# Patient Record
Sex: Male | Born: 1935 | Race: White | Hispanic: No | Marital: Married | Smoking: Former smoker
Health system: Southern US, Community
[De-identification: ages and names within clinical notes are randomized; demographics above are authoritative.]

## PROBLEM LIST (undated history)

## (undated) DIAGNOSIS — D72829 Elevated white blood cell count, unspecified: Secondary | ICD-10-CM

## (undated) DIAGNOSIS — J9690 Respiratory failure, unspecified, unspecified whether with hypoxia or hypercapnia: Secondary | ICD-10-CM

## (undated) DIAGNOSIS — J449 Chronic obstructive pulmonary disease, unspecified: Secondary | ICD-10-CM

## (undated) DIAGNOSIS — I10 Essential (primary) hypertension: Secondary | ICD-10-CM

## (undated) DIAGNOSIS — I714 Abdominal aortic aneurysm, without rupture: Secondary | ICD-10-CM

## (undated) DIAGNOSIS — I4891 Unspecified atrial fibrillation: Secondary | ICD-10-CM

## (undated) DIAGNOSIS — J209 Acute bronchitis, unspecified: Secondary | ICD-10-CM

## (undated) DIAGNOSIS — C679 Malignant neoplasm of bladder, unspecified: Secondary | ICD-10-CM

## (undated) DIAGNOSIS — E871 Hypo-osmolality and hyponatremia: Secondary | ICD-10-CM

## (undated) DIAGNOSIS — I509 Heart failure, unspecified: Secondary | ICD-10-CM

## (undated) HISTORY — PX: TRANSURETHRAL RESECTION OF BLADDER: SUR1395

## (undated) HISTORY — DX: Acute bronchitis, unspecified: J20.9

## (undated) HISTORY — DX: Chronic obstructive pulmonary disease, unspecified: J44.9

## (undated) HISTORY — DX: Heart failure, unspecified: I50.9

## (undated) HISTORY — DX: Abdominal aortic aneurysm, without rupture: I71.4

## (undated) HISTORY — DX: Hypo-osmolality and hyponatremia: E87.1

## (undated) HISTORY — DX: Malignant neoplasm of bladder, unspecified: C67.9

## (undated) HISTORY — DX: Elevated white blood cell count, unspecified: D72.829

## (undated) HISTORY — DX: Essential (primary) hypertension: I10

## (undated) HISTORY — DX: Unspecified atrial fibrillation: I48.91

## (undated) HISTORY — DX: Respiratory failure, unspecified, unspecified whether with hypoxia or hypercapnia: J96.90

---

## 2003-05-16 ENCOUNTER — Other Ambulatory Visit: Payer: Self-pay

## 2008-05-25 DIAGNOSIS — I714 Abdominal aortic aneurysm, without rupture, unspecified: Secondary | ICD-10-CM

## 2008-05-25 HISTORY — DX: Abdominal aortic aneurysm, without rupture, unspecified: I71.40

## 2008-05-25 HISTORY — DX: Abdominal aortic aneurysm, without rupture: I71.4

## 2008-08-24 ENCOUNTER — Observation Stay: Payer: Self-pay | Admitting: Family Medicine

## 2010-05-15 ENCOUNTER — Encounter: Payer: Self-pay | Admitting: Cardiovascular Disease

## 2010-05-15 ENCOUNTER — Ambulatory Visit: Payer: Self-pay | Admitting: Internal Medicine

## 2010-05-16 ENCOUNTER — Inpatient Hospital Stay: Payer: Self-pay | Admitting: Internal Medicine

## 2010-05-16 ENCOUNTER — Encounter: Payer: Self-pay | Admitting: Cardiovascular Disease

## 2010-05-18 ENCOUNTER — Encounter: Payer: Self-pay | Admitting: Cardiovascular Disease

## 2010-05-20 ENCOUNTER — Telehealth: Payer: Self-pay | Admitting: Cardiovascular Disease

## 2010-05-28 ENCOUNTER — Encounter: Payer: Self-pay | Admitting: Cardiovascular Disease

## 2010-05-28 ENCOUNTER — Ambulatory Visit
Admission: RE | Admit: 2010-05-28 | Discharge: 2010-05-28 | Payer: Self-pay | Source: Home / Self Care | Attending: Cardiovascular Disease | Admitting: Cardiovascular Disease

## 2010-05-28 DIAGNOSIS — I4891 Unspecified atrial fibrillation: Secondary | ICD-10-CM | POA: Insufficient documentation

## 2010-05-28 DIAGNOSIS — J4489 Other specified chronic obstructive pulmonary disease: Secondary | ICD-10-CM | POA: Insufficient documentation

## 2010-05-28 DIAGNOSIS — R0602 Shortness of breath: Secondary | ICD-10-CM | POA: Insufficient documentation

## 2010-05-28 DIAGNOSIS — J449 Chronic obstructive pulmonary disease, unspecified: Secondary | ICD-10-CM | POA: Insufficient documentation

## 2010-06-16 ENCOUNTER — Telehealth: Payer: Self-pay | Admitting: Cardiovascular Disease

## 2010-06-26 NOTE — Assessment & Plan Note (Signed)
Summary: NP6/AMD   Visit Type:  Initial Consult [facility] [place]  CC:  "doing well" denies chest pains, SOB, and and palpitations.  History of Present Illness: Michael Kline is a very pleasant 76 year old gentleman with a long smoking history, hypertension, severe COPD, remote history of atrial ablation in 2004 who presented to [facility] on May 16 2010 with chest congestion, atrial fibrillation with ventricular rate of 140 beats per minute, improved on rate control medication and started on pradaxa who presents for routine followup and to establish care in the clinic.  He reports that he is doing well. He is weaning off his steroids and slowly weaning off his oxygen. He reports having heart rates in the high 80s to 90s. He denies any chest pain. He does still have mild chest congestion. No significant edema. No lightheadedness or dizziness. Blood pressure has been stable he reports.  EKG shows atrial fibrillation with ventricular rate 94 beats a minute, no significant ST or T wave changes  Echocardiogram from the hospital shows ejection fraction 50-55%, mild aortic valve stenosis with mean gradient of 7 mm mercury, estimated aortic valve area of 1.5 cm, mild MR, mildly dilated left atrium, mildly elevated right ventricular systolic pressure estimated at 40-50 m of mercury  Preventive Screening-Counseling & Management  Alcohol-Tobacco     Smoking Status: quit  Caffeine-Diet-Exercise     Does Patient Exercise: yes      Drug Use:  no.    Current Medications (verified): 1)  Digoxin 0.25 Mg Tabs (Digoxin) .... One Tablet Once Daily 2)  Pradaxa 150 Mg Caps (Dabigatran Etexilate Mesylate) .... One Tablet Two Times A Day 3)  Advair Diskus 250-50 Mcg/dose Aepb (Fluticasone-Salmeterol) .... Two Puffs Every 6 Hours As Needed 4)  Prednisone Taper .... As Instructed 5)  Lasix 20 Mg Tabs (Furosemide) .... One Tablet Once Daily 6)  Oxygen 2 Liters 7)  Cardizem 200 Mg Tabs  (Diltiazem Hcl) .Marland Kitchen.. 1 Tablet Daily 8)  Combivent 18-103 Mcg/act Aero (Ipratropium-Albuterol) .... 2 Puffs Every 6 Hours  Allergies (verified): 1)  ! Vicodin  Past History:  Past Medical History: Last updated: 05/27/2010 Acute hypoxic respiratory failure secondary to COPD exacerbation and bronchitis COPD Acute bronchitis CHF with diastolic dysfunction.  EF 50-55% Hx. of A-Fib Hypertension Hx. of bladder cancer, s/p transurethral resection Transient leukocytosis in the hospital secondary to being on steroids; 2011 Hyponatremia in the hospital; 2011  Past Surgical History: Last updated: 05/27/2010 bladder cancer; s/p transurethral resection.  Family History: Last updated: 06/12/2010 Father:deceased-prostate cancer Mother:old age  Social History: Last updated: 06-12-2010 Retired  Married  Drug Use - no Tobacco Use - Former.  Alcohol Use - no Regular Exercise - yes  Risk Factors: Exercise: yes (2010/06/12)  Risk Factors: Smoking Status: quit (12-Jun-2010)  Family History: Father:deceased-prostate cancer Mother:old age  Social History: Retired  Married  Drug Use - no Tobacco Use - Former.  Alcohol Use - no Regular Exercise - yes Drug Use:  no Smoking Status:  quit Does Patient Exercise:  yes  Review of Systems       The patient complains of dyspnea on exertion.  The patient denies fever, weight loss, weight gain, vision loss, decreased hearing, hoarseness, chest pain, syncope, peripheral edema, prolonged cough, abdominal pain, incontinence, muscle weakness, depression, and enlarged lymph nodes.    Vital Signs:  Patient profile:   76 year old male Height:      67 inches Weight:      181.50 pounds BMI:  28.53 Pulse rate:   94 / minute BP sitting:   142 / 70  (left arm) Cuff size:   regular  Vitals Entered By: Michael Kline CMA (May 28, 2010 2:25 PM)  Physical Exam  General:  older gentleman in no apparent distress, amylase without assistance,  off oxygen in the office and appears comfortable at rest Head:  normocephalic and atraumatic Neck:  Neck supple, no JVD. No masses, thyromegaly or abnormal cervical nodes. Lungs:  decreased breath sounds bilaterally throughout Heart:  Non-displaced PMI, chest non-tender; irregular rate and rhythm, S1, S2 without murmurs, rubs or gallops. Carotid upstroke normal, no bruit.  Pedals normal pulses. No edema, no varicosities. Abdomen:  Bowel sounds positive; abdomen soft and non-tender without masses Msk:  Back normal, normal gait. Muscle strength and tone normal. Pulses:  pulses normal in all 4 extremities Extremities:  No clubbing or cyanosis. Neurologic:  Alert and oriented x 3. Skin:  Intact without lesions or rashes. Psych:  Normal affect.   Impression & Recommendations:  Problem # 1:  ATRIAL FIBRILLATION (ICD-427.31) he is currently on anticoagulation. Rate control is adequate. We'll continue him on diltiazem 300 mg daily and digoxin. We have given him some samples of bystolic 5 mg that he can take as needed for heart rate greater than 100 at rest. He could also take these prior to exertion.  We will meet with him in several weeks' time to discuss possible pharmacological cardioversion or DC cardioversion.  The following medications were removed from the medication list:    Aspir-low 81 Mg Tbec (Aspirin) ..... One tablet once daily His updated medication list for this problem includes:    Digoxin 0.25 Mg Tabs (Digoxin) ..... One tablet once daily    Bystolic 5 Mg Tabs (Nebivolol hcl) .Marland Kitchen... Take one tablet once daily.  Problem # 2:  COPD (ICD-496) Severe COPD. He is coming on oxygen and inhalers. He will reestablish care at the [facility].  His updated medication list for this problem includes:    Advair Diskus 250-50 Mcg/dose Aepb (Fluticasone-salmeterol) .Marland Kitchen..Marland Kitchen Two puffs every 6 hours as needed    Combivent 18-103 Mcg/act Aero (Ipratropium-albuterol) .Marland Kitchen... 2 puffs every 6  hours  Problem # 3:  DYSPNEA (ICD-786.05) Shortness of breath likely secondary to underlying COPD with resolving COPD exacerbation, weaning off his steroids.  The following medications were removed from the medication list:    Aspir-low 81 Mg Tbec (Aspirin) ..... One tablet once daily His updated medication list for this problem includes:    Digoxin 0.25 Mg Tabs (Digoxin) ..... One tablet once daily    Lasix 20 Mg Tabs (Furosemide) ..... One tablet once daily    Bystolic 5 Mg Tabs (Nebivolol hcl) .Marland Kitchen... Take one tablet once daily.  Patient Instructions: 1)  Your physician recommends that you schedule a follow-up appointment in: 1 month 2)  Your physician has recommended you make the following change in your medication: START Bystolic 5mg  once daily. Prescriptions: PRADAXA 150 MG CAPS (DABIGATRAN ETEXILATE MESYLATE) one tablet two times a day  #60 x 0   Entered by:   Lanny Hurst RN   Authorized by:   Dossie Arbour MD   Signed by:   Lanny Hurst RN on 05/28/2010   Method used:   [facility] to        [facility]  [address] [phone]       [address]       [place]  [place], [place]  [address]       Ph: [phone] or [phone]       Fax: [phone]   RxID:   [id number]

## 2010-06-26 NOTE — Progress Notes (Signed)
Summary: RX  Phone Note Refill Request Call back at Home Phone [phone] Message from:  Patient on June 16, 2010 2:32 PM  Refills Requested: Medication #1:  DIGOXIN 0.25 MG TABS one tablet once daily [facility] [place]  Initial call taken by: Harlon Flor,  June 16, 2010 2:32 PM    Prescriptions: DIGOXIN 0.25 MG TABS (DIGOXIN) one tablet once daily  #30 x 6   Entered by:   Bishop Dublin, CMA   Authorized by:   Dossie Arbour MD   Signed by:   Bishop Dublin, CMA on 06/17/2010   Method used:   [facility]        [facility] [phone][facility] (retail)       [address]       [place], [place]  [address]       Ph: [phone] or [phone]       Fax: [phone]   RxID:   [id number]

## 2010-06-26 NOTE — Progress Notes (Signed)
Summary: Medications  Phone Note Call from Patient Call back at Home Phone [phone]   Caller: Self Call For: Gollan Summary of Call: Pt needs a call regarding his discharge medications. Initial call taken by: Harlon Flor,  May 20, 2010 3:57 PM  Follow-up for Phone Call        Pt wanted to be sure he was supposed to take Digoxin and Cardizem together per d/c summary. Pulled d/c note from [facility], pt was discharged on both meds and made pt aware he could take both together per order. Follow-up by: Lanny Hurst RN,  May 20, 2010 4:39 PM

## 2010-06-26 NOTE — Letter (Signed)
[facility]   [facility]   Imported By: Roderic Ovens 06/11/2010 14:39:13  _____________________________________________________________________  External Attachment:    Type:   Image     Comment:   External Document

## 2010-06-30 ENCOUNTER — Encounter: Payer: Self-pay | Admitting: Cardiovascular Disease

## 2010-06-30 ENCOUNTER — Ambulatory Visit (INDEPENDENT_AMBULATORY_CARE_PROVIDER_SITE_OTHER): Payer: MEDICARE | Admitting: Cardiovascular Disease

## 2010-06-30 DIAGNOSIS — E785 Hyperlipidemia, unspecified: Secondary | ICD-10-CM

## 2010-06-30 DIAGNOSIS — J4489 Other specified chronic obstructive pulmonary disease: Secondary | ICD-10-CM

## 2010-06-30 DIAGNOSIS — I10 Essential (primary) hypertension: Secondary | ICD-10-CM

## 2010-06-30 DIAGNOSIS — J449 Chronic obstructive pulmonary disease, unspecified: Secondary | ICD-10-CM

## 2010-06-30 DIAGNOSIS — I4891 Unspecified atrial fibrillation: Secondary | ICD-10-CM

## 2010-07-02 NOTE — Letter (Signed)
Summary: [facility]  [facility]   Imported By: Marylou Mccoy 06/25/2010 11:36:39  _____________________________________________________________________  External Attachment:    Type:   Image     Comment:   External Document

## 2010-07-02 NOTE — Letter (Signed)
Summary: [facility] - Consult  [facility] - Consult   Imported By: Marylou Mccoy 06/24/2010 11:44:33  _____________________________________________________________________  External Attachment:    Type:   Image     Comment:   External Document

## 2010-07-02 NOTE — Letter (Signed)
Summary: [facility] - Discharge Instructions  [facility] - Discharge Instructions   Imported By: Marylou Mccoy 06/25/2010 09:20:15  _____________________________________________________________________  External Attachment:    Type:   Image     Comment:   External Document

## 2010-07-07 ENCOUNTER — Telehealth: Payer: Self-pay | Admitting: Cardiovascular Disease

## 2010-07-10 NOTE — Assessment & Plan Note (Signed)
Summary: F/U 1 MONTH/SAB   Visit Type:  Follow-up [facility] [place]  CC:  c/o shortness of breath..  History of Present Illness: Michael Kline is a very pleasant 77 year old gentleman with a long smoking history, hypertension, severe COPD, remote history of atrial ablation in 2004 who presented to [facility] on May 16 2010 with bronchitis, atrial fibrillation with ventricular rate of 140 beats per minute, improved on rate control medication and started on pradaxa who presents for routine followup and to establish care in the clinic.  He reports that he is doing well. He uses his oxygen PRN. He reports having heart rates in the 70s to 80s. He denies any chest pain. He has improved chest congestion. No significant edema. No lightheadedness or dizziness. Blood pressure has been stable.  EKG shows atrial fibrillation with ventricular rate 78 beats a minute, nonspecific ST changes in leads V4 through V6, 2, 3, aVF  Echocardiogram from the hospital shows ejection fraction 50-55%, mild aortic valve stenosis with mean gradient of 7 mm mercury, estimated aortic valve area of 1.5 cm, mild MR, mildly dilated left atrium, mildly elevated right ventricular systolic pressure estimated at 40-50 m of mercury  Current Medications (verified): 1)  Digoxin 0.25 Mg Tabs (Digoxin) .... One Tablet Once Daily 2)  Pradaxa 150 Mg Caps (Dabigatran Etexilate Mesylate) .... One Tablet Once Daily 3)  Advair Diskus 250-50 Mcg/dose Aepb (Fluticasone-Salmeterol) .... Two Puffs Every 6 Hours As Needed 4)  Hydrochlorothiazide 25 Mg Tabs (Hydrochlorothiazide) .... One Tablet Once Daily 5)  Oxygen 2 Liters 6)  Diltiazem Hcl Er Beads 300 Mg Xr24h-Cap (Diltiazem Hcl Er Beads) .... One Tablet Once Daily At Noon 7)  Combivent 18-103 Mcg/act Aero (Ipratropium-Albuterol) .... 2 Puffs Every 6 Hours 8)  Bystolic 5 Mg Tabs (Nebivolol Hcl) .... Take One Tablet Once Daily. 9)  Fish Oil 1000 Mg Caps (Omega-3 Fatty Acids)  .... One Tablet Once Daily 10)  Eye Vitamins  Caps (Multiple Vitamins-Minerals) .... One Daily  Allergies (verified): 1)  ! Vicodin  Past History:  Past Medical History: Last updated: 05/27/2010 Acute hypoxic respiratory failure secondary to COPD exacerbation and bronchitis COPD Acute bronchitis CHF with diastolic dysfunction.  EF 50-55% Hx. of A-Fib Hypertension Hx. of bladder cancer, s/p transurethral resection Transient leukocytosis in the hospital secondary to being on steroids; 2011 Hyponatremia in the hospital; 2011  Past Surgical History: Last updated: 05/27/2010 bladder cancer; s/p transurethral resection.  Family History: Last updated: 2010-06-21 Father:deceased-prostate cancer Mother:old age  Social History: Last updated: 21-Jun-2010 Retired  Married  Drug Use - no Tobacco Use - Former.  Alcohol Use - no Regular Exercise - yes  Risk Factors: Exercise: yes (06/21/10)  Risk Factors: Smoking Status: quit (Jun 21, 2010)  Review of Systems       The patient complains of dyspnea on exertion.  The patient denies fever, weight loss, weight gain, vision loss, decreased hearing, hoarseness, chest pain, syncope, peripheral edema, prolonged cough, abdominal pain, incontinence, muscle weakness, depression, and enlarged lymph nodes.         + cough (chronic)  Vital Signs:  Patient profile:   76 year old male Height:      67 inches Weight:      182.25 pounds BMI:     28.65 Pulse rate:   78 / minute BP sitting:   117 / 70  (left arm) Cuff size:   regular  Vitals Entered By: Bishop Dublin, CMA (June 30, 2010 10:41 AM)  Physical Exam  General:  older gentleman in no apparent distress, ambulating without assistance, off oxygen in the office and appears comfortable at rest Head:  normocephalic and atraumatic Neck:  Neck supple, no JVD. No masses, thyromegaly or abnormal cervical nodes. Lungs:  decreased breath sounds bilaterally throughout Heart:   Non-displaced PMI, chest non-tender; irregular rate and rhythm, S1, S2 without murmurs, rubs or gallops. Carotid upstroke normal, no bruit.  Pedals normal pulses. No edema, no varicosities. Abdomen:  Bowel sounds positive; abdomen soft and non-tender without masses Msk:  Back normal, normal gait. Muscle strength and tone normal. Pulses:  pulses normal in all 4 extremities Extremities:  No clubbing or cyanosis. Neurologic:  Alert and oriented x 3. Skin:  Intact without lesions or rashes. Psych:  Normal affect.   Impression & Recommendations:  Problem # 1:  ATRIAL FIBRILLATION (ICD-427.31) rate is significantly improved on his current medications. We have suggested that he continue bystolic 5 mg daily. We also encouraged him to take pradaxa b.i.d. instead of once a day. We can discuss cardioversion with him at a later date as he has not been adequately anticoagulated.  His updated medication list for this problem includes:    Digoxin 0.25 Mg Tabs (Digoxin) ..... One tablet once daily    Bystolic 5 Mg Tabs (Nebivolol hcl) .Marland Kitchen... Take one tablet once daily.  Orders: EKG w/ Interpretation (93000)  Problem # 2:  COPD (ICD-496) Bronchitis seems to have significantly improved. He is on oxygen p.r.n. with inhalers and doing well.  His updated medication list for this problem includes:    Advair Diskus 250-50 Mcg/dose Aepb (Fluticasone-salmeterol) .Marland Kitchen..Marland Kitchen Two puffs every 6 hours as needed    Combivent 18-103 Mcg/act Aero (Ipratropium-albuterol) .Marland Kitchen... 2 puffs every 6 hours  Problem # 3:  DYSPNEA (ICD-786.05) He continues to have mild shortness of breath though this is likely secondary to underlying severe COPD. Overall he is stable  His updated medication list for this problem includes:    Digoxin 0.25 Mg Tabs (Digoxin) ..... One tablet once daily    Hydrochlorothiazide 25 Mg Tabs (Hydrochlorothiazide) ..... One tablet once daily    Diltiazem Hcl Er Beads 300 Mg Xr24h-cap (Diltiazem hcl er  beads) ..... One tablet once daily at noon    Bystolic 5 Mg Tabs (Nebivolol hcl) .Marland Kitchen... Take one tablet once daily.

## 2010-07-16 NOTE — Progress Notes (Signed)
Summary: Order for oxygen pick up  Phone Note Call from Patient Call back at Home Phone [phone]   Caller: Self Call For: Gollan Summary of Call: Would like for an order to be sent to [facility] to pick up the pt's oxygen. Initial call taken by: Harlon Flor,  July 07, 2010 10:53 AM  Follow-up for Phone Call        Patient state is not using oxygen anymore.  Do you feel it is okay to send the oxygen back to [facility]? Follow-up by: Bishop Dublin, CMA,  July 07, 2010 4:55 PM  Additional Follow-up for Phone Call Additional follow up Details #1::        is it costing him? Once he sends it back, hard to get again     Appended Document: Order for oxygen pick up notified patient if not cost anything, Dr. Mariah Milling suggested you keep the oxygen because it is hard to get back.  The patient will check on this and call our office back with what to do.

## 2010-08-13 ENCOUNTER — Telehealth: Payer: Self-pay | Admitting: Cardiovascular Disease

## 2010-08-13 NOTE — Telephone Encounter (Signed)
Pt would like oxygen tank to be picked up.

## 2010-08-14 ENCOUNTER — Telehealth: Payer: Self-pay

## 2010-08-14 NOTE — Telephone Encounter (Signed)
He can chose if he would like oxygen or not.  Maybe they can provide a free oxygen generator/  He may need it more than he thinks if he has severe COPD  

## 2010-08-14 NOTE — Telephone Encounter (Signed)
Notified patient regarding oxygen.  The patient would like to send oxygen tank back because it is costing him $28.00 a month and he does feel he needs it at this time.

## 2010-08-14 NOTE — Telephone Encounter (Signed)
He can chose if he would like oxygen or not.  Maybe they can provide a free oxygen generator/  He may need it more than he thinks if he has severe COPD

## 2010-08-19 ENCOUNTER — Telehealth: Payer: Self-pay

## 2010-08-19 NOTE — Telephone Encounter (Signed)
Notified [facility] patient needs to have the oxygen picked up.  A letter was faxed to [phone] to [facility].

## 2010-08-20 ENCOUNTER — Telehealth: Payer: Self-pay

## 2010-08-20 NOTE — Telephone Encounter (Signed)
Letter faxed to [facility] for oxygen pick up.

## 2010-09-03 ENCOUNTER — Telehealth: Payer: Self-pay | Admitting: Cardiovascular Disease

## 2010-09-03 NOTE — Telephone Encounter (Signed)
Questions about prescriptions.  Would like samples of Pradaxa.

## 2010-09-04 NOTE — Telephone Encounter (Signed)
Attempted to call patient and phone has been disconnected

## 2010-09-26 ENCOUNTER — Other Ambulatory Visit: Payer: Self-pay

## 2010-09-26 MED ORDER — DABIGATRAN ETEXILATE MESYLATE 150 MG PO CAPS: 150.0000 mg | ORAL_CAPSULE | Freq: Two times a day (BID) | ORAL | Status: DC

## 2010-12-23 ENCOUNTER — Encounter: Payer: Self-pay | Admitting: Cardiovascular Disease

## 2010-12-29 ENCOUNTER — Encounter: Payer: Self-pay | Admitting: Cardiovascular Disease

## 2010-12-29 ENCOUNTER — Ambulatory Visit (INDEPENDENT_AMBULATORY_CARE_PROVIDER_SITE_OTHER): Payer: Medicare Other | Admitting: Cardiovascular Disease

## 2010-12-29 VITALS — BP 148/79 | HR 88 | Ht 67.0 in | Wt 176.0 lb

## 2010-12-29 DIAGNOSIS — J449 Chronic obstructive pulmonary disease, unspecified: Secondary | ICD-10-CM

## 2010-12-29 DIAGNOSIS — I4891 Unspecified atrial fibrillation: Secondary | ICD-10-CM

## 2010-12-29 DIAGNOSIS — R0602 Shortness of breath: Secondary | ICD-10-CM

## 2010-12-29 MED ORDER — DABIGATRAN ETEXILATE MESYLATE 150 MG PO CAPS: 150.0000 mg | ORAL_CAPSULE | Freq: Two times a day (BID) | ORAL | Status: DC

## 2010-12-29 NOTE — Patient Instructions (Signed)
You are doing well. Please restart pradaxa 150 mg twice a day. Please call us if you have new issues that need to be addressed before your next appt.  We will call you for a follow up Appt. In 6 months

## 2010-12-29 NOTE — Progress Notes (Signed)
Patient ID: Michael Kline, male    DOB: 1935/05/06, 76 y.o.   MRN: [id number]  HPI Comments: Mr. Age is a very pleasant 76 year old gentleman with a long smoking history, hypertension, severe COPD, remote history of atrial ablation in 2004 who presented to [facility] on May 16 2010 with bronchitis, atrial fibrillation with ventricular rate of 140 beats per minute, improved on rate control medication and started on pradaxa who presents for routine followup and Present for routine followup.  He reports that he stopped his Pradaxa several months ago. He was told by the [facility] that they did not cover The medication. He did not want to go on warfarin at the time. He has to pay out of pocket for his Permax approximately $45. He put himself on aspirin. He also stopped his cholesterol medication and does not want to be on a statin. He reports that he is doing well. He uses his oxygen PRN. He reports having heart rates in the 70s to 80s. He denies any chest pain. He has improved chest congestion. No significant edema. No lightheadedness or dizziness. Blood pressure has been stable.  He recently pushed mowed in the heat and had to stop after 20 minutes to catch his breath.    EKG shows atrial fibrillation with ventricular rate 88 beats a minute, nonspecific ST changes in leads V4 through V6, 2, 3, aVF   Old Echocardiogram from the hospital shows ejection fraction 50-55%, mild aortic valve stenosis with mean gradient of 7 mm mercury, estimated aortic valve area of 1.5 cm, mild MR, mildly dilated left atrium, mildly elevated right ventricular systolic pressure estimated at 40-50 m of mercury      Outpatient Encounter Prescriptions as of 12/29/2010  Medication Sig Dispense Refill  . albuterol (PROVENTIL) (2.5 MG/3ML) 0.083% nebulizer solution Take 2.5 mg by nebulization every 6 (six) hours as needed. As needed        . aspirin 325 MG tablet Take 325 mg by mouth daily.        .  budesonide-formoterol (SYMBICORT) 80-4.5 MCG/ACT inhaler Inhale 2 puffs into the lungs 2 (two) times daily.        . digoxin (LANOXIN) 0.25 MG tablet Take 250 mcg by mouth daily.        Marland Kitchen diltiazem (CARDIZEM CD) 300 MG 24 hr capsule Take 300 mg by mouth daily.        . hydrochlorothiazide 25 MG tablet Take 25 mg by mouth daily.        . Multiple Vitamins-Minerals (EYE VITAMINS) CAPS Take 1 capsule by mouth daily.        . nebivolol (BYSTOLIC) 5 MG tablet Take 5 mg by mouth daily. As needed.      . Omega-3 Fatty Acids (FISH OIL) 1000 MG CAPS Take 1 capsule by mouth daily.       Marland Kitchen tiotropium (SPIRIVA) 18 MCG inhalation capsule Place 18 mcg into inhaler and inhale daily.        . dabigatran (PRADAXA) 150 MG CAPS Take 1 capsule (150 mg total) by mouth every 12 (twelve) hours.  60 capsule  6     Review of Systems  Constitutional: Negative.   HENT: Negative.   Eyes: Negative.   Respiratory: Positive for shortness of breath.   Cardiovascular: Negative.   Gastrointestinal: Negative.   Musculoskeletal: Negative.   Skin: Negative.   Neurological: Negative.   Hematological: Negative.   Psychiatric/Behavioral: Negative.   All other systems reviewed and are negative.  BP 148/79  Pulse 88  Ht 5\' 7"  (1.702 m)  Wt 176 lb (79.833 kg)  BMI 27.57 kg/m2  Physical Exam  Nursing note and vitals reviewed. Constitutional: He is oriented to person, place, and time. He appears well-developed and well-nourished.  HENT:  Head: Normocephalic.  Nose: Nose normal.  Mouth/Throat: Oropharynx is clear and moist.  Eyes: Conjunctivae are normal. Pupils are equal, round, and reactive to light.  Neck: Normal range of motion. Neck supple. No JVD present.  Cardiovascular: Normal rate, regular rhythm, S1 normal, S2 normal, normal heart sounds and intact distal pulses.  Exam reveals no gallop and no friction rub.   No murmur heard. Pulmonary/Chest: Effort normal. No respiratory distress. He has decreased breath  sounds. He has no wheezes. He has no rales. He exhibits no tenderness.  Abdominal: Soft. Bowel sounds are normal. He exhibits no distension. There is no tenderness.  Musculoskeletal: Normal range of motion. He exhibits no edema and no tenderness.  Lymphadenopathy:    He has no cervical adenopathy.  Neurological: He is alert and oriented to person, place, and time. Coordination normal.  Skin: Skin is warm and dry. No rash noted. No erythema.  Psychiatric: He has a normal mood and affect. His behavior is normal. Judgment and thought content normal.           Assessment and Plan

## 2010-12-29 NOTE — Assessment & Plan Note (Signed)
He does have a reasonable exercise tolerance but does get shortness of breath with heavy exertion. This is likely secondary to underlying COPD. Uncertain if his heart rate climbs drastically with heavy exertion. He does take bystolic p.r.n. Though reports only taking this Spar[place].

## 2010-12-29 NOTE — Assessment & Plan Note (Signed)
Rate appears to be adequately controlled on his current medications. We did discuss anticoagulation with him and he will restart pradaxa and we'll discuss being on warfarin when he meets with Merlene Pulling at the [facility].

## 2010-12-29 NOTE — Assessment & Plan Note (Signed)
He does have underlying COPD though reports being stable on his inhalers. Chronic mild cough with no recent arthritis episodes.

## 2011-01-05 ENCOUNTER — Telehealth: Payer: Self-pay

## 2011-01-05 NOTE — Telephone Encounter (Signed)
Per Dr. Mariah Milling told patient to stop Pradaxa until we hear back from CBC that will be done tomorrow 01/06/2012.  The patient was told need to get OTC prevacid to see if that will help the burning in stomach.  The patient was also instructed to [facility]). Told the patient he will need to be on some type of blood thinner either warfarin or pradaxa for A-Fib.  Told the patient the risk of being off a blood thinner and that if he has some bleed, he may need a scope to see if has an ulcer.  The patient will come for blood work in the am. He understands to stay off the pradaxa until he hears back from our office.

## 2011-01-05 NOTE — Telephone Encounter (Signed)
The patient is having some burning in stomach with black/tar stools and is concerned he may have some bleeding.  He is taking Pradaxa 150 mg twice a day. He noticed the symptoms 3-4 days after starting the pradaxa. Please call with what to do?

## 2011-01-06 ENCOUNTER — Telehealth: Payer: Self-pay | Admitting: *Deleted

## 2011-01-06 ENCOUNTER — Ambulatory Visit (INDEPENDENT_AMBULATORY_CARE_PROVIDER_SITE_OTHER): Payer: Medicare Other | Admitting: *Deleted

## 2011-01-06 DIAGNOSIS — Z7901 Long term (current) use of anticoagulants: Secondary | ICD-10-CM

## 2011-01-06 DIAGNOSIS — R109 Unspecified abdominal pain: Secondary | ICD-10-CM

## 2011-01-06 DIAGNOSIS — K921 Melena: Secondary | ICD-10-CM

## 2011-01-06 DIAGNOSIS — R0602 Shortness of breath: Secondary | ICD-10-CM

## 2011-01-06 NOTE — Telephone Encounter (Signed)
Pt in today for bloodwork, cbc drawn since pt had black stools and stomach pain yesterday. Pt states he has held Pradaxa and denies pain today and states black stools are gone. Pt will f/u with his PCP at [facility], but do we need to do any tests/scan in the meantime? Please advise. Otherwise, told pt we will call with his results tomorrow.

## 2011-01-07 LAB — CBC WITH DIFFERENTIAL/PLATELET
Eosinophils Absolute: 0.5 10*3/uL (ref 0.0–0.7)
Hemoglobin: 14.4 g/dL (ref 13.0–17.0)
Lymphocytes Relative: 16 % (ref 12–46)
Lymphs Abs: 1.1 10*3/uL (ref 0.7–4.0)
MCH: 31 pg (ref 26.0–34.0)
Monocytes Relative: 10 % (ref 3–12)
Neutro Abs: 4.6 10*3/uL (ref 1.7–7.7)
Neutrophils Relative %: 66 % (ref 43–77)
Platelets: 263 10*3/uL (ref 150–400)
RBC: 4.64 MIL/uL (ref 4.22–5.81)
WBC: 7 10*3/uL (ref 4.0–10.5)

## 2011-01-07 NOTE — Telephone Encounter (Signed)
Cbc is normal. Does he want our office to start warfarin for atrial fib?

## 2011-01-08 NOTE — Telephone Encounter (Signed)
Attempted to contact pt, LMOM TCB.  

## 2011-01-08 NOTE — Telephone Encounter (Signed)
Notified patient CBC looked normal.  Told patient he needs to be on some type of blood thinner for A-Fib.  He states has not had any more black stools & stomach has not been burning anymore.  He will go back on the Pradaxa 150 mg bid and if he notice any change while being back on the pradaxa, he will give our office a call.

## 2011-03-17 ENCOUNTER — Telehealth: Payer: Self-pay

## 2011-03-17 MED ORDER — NEBIVOLOL HCL 5 MG PO TABS: 5.0000 mg | ORAL_TABLET | Freq: Every day | ORAL | Status: DC

## 2011-03-17 NOTE — Telephone Encounter (Signed)
Refill sent for Bystolic 5 mg one tablet daily.

## 2011-04-14 ENCOUNTER — Telehealth: Payer: Self-pay

## 2011-04-14 NOTE — Telephone Encounter (Signed)
The pradaxa is too expensive for him; cost him $285.00 per month.  He would like to know if can start on warfarin and if so what dose do you recommend?  Please advise what to do? He said, he could come for PT/INR checks here.

## 2011-04-15 NOTE — Telephone Encounter (Signed)
Start warfarin 5 mg daily overlapping with pradaxa for three days then appt with erika after 5 to 7 days of warfarin

## 2011-04-17 ENCOUNTER — Telehealth: Payer: Self-pay

## 2011-04-17 MED ORDER — WARFARIN SODIUM 5 MG PO TABS: 5.0000 mg | ORAL_TABLET | Freq: Every day | ORAL | Status: DC

## 2011-04-17 NOTE — Telephone Encounter (Signed)
New Rx sent to [facility] in [place].

## 2011-04-17 NOTE — Telephone Encounter (Signed)
Patient notified needs to take warfarin 5 mg daily overlapping with pradaxa for 3 days starting this on Nov. 25, 2012 with a PT/INR check on Nov. 30, 2012. The patient understands instructions and a Rx will be sent for Warfarin 5 mg to [facility] in [place].

## 2011-04-17 NOTE — Telephone Encounter (Signed)
The patient was instructed to take warfarin 5 mg overlapping with pradaxa for 3 days with a PT/INR check on Friday, Nov. 30, 2012.

## 2011-04-17 NOTE — Telephone Encounter (Signed)
Duplicate message. 

## 2011-04-21 ENCOUNTER — Other Ambulatory Visit: Payer: Self-pay

## 2011-04-24 ENCOUNTER — Ambulatory Visit (INDEPENDENT_AMBULATORY_CARE_PROVIDER_SITE_OTHER): Payer: Medicare Other | Admitting: *Deleted

## 2011-04-24 DIAGNOSIS — I4891 Unspecified atrial fibrillation: Secondary | ICD-10-CM

## 2011-04-29 ENCOUNTER — Ambulatory Visit (INDEPENDENT_AMBULATORY_CARE_PROVIDER_SITE_OTHER): Payer: Medicare Other | Admitting: Emergency Medicine

## 2011-04-29 DIAGNOSIS — I4891 Unspecified atrial fibrillation: Secondary | ICD-10-CM

## 2011-04-29 MED ORDER — WARFARIN SODIUM 5 MG PO TABS: ORAL_TABLET | ORAL | Status: DC

## 2011-05-06 ENCOUNTER — Encounter: Payer: Medicare Other | Admitting: Emergency Medicine

## 2011-05-29 ENCOUNTER — Telehealth: Payer: Self-pay

## 2011-05-29 MED ORDER — NEBIVOLOL HCL 5 MG PO TABS: 5.0000 mg | ORAL_TABLET | Freq: Every day | ORAL | Status: DC

## 2011-05-29 NOTE — Telephone Encounter (Signed)
Refill sent for bystolic 

## 2011-07-03 ENCOUNTER — Telehealth: Payer: Self-pay | Admitting: Cardiovascular Disease

## 2011-07-03 NOTE — Telephone Encounter (Signed)
Pt calling wanting to know if Dr Mariah Milling will write an order for [facility] to bring his o2.

## 2011-07-03 NOTE — Telephone Encounter (Signed)
Attempted to call pt. Phone sound busy. 

## 2011-07-03 NOTE — Telephone Encounter (Signed)
Patient states has  a respiratory infection which is being treated with antibiotics and prednisone by PCP . Patient states called his PA and was told to come to their office to be seen prior ordering the 02. Patient would like for Dr. Mariah Milling to write an order for him instead for the [facility] to bring his O2 tank  back. He was using O2 /2 liters nasal prone  prior having this respiratory  Infection order by Dr. Mariah Milling MD, then a month ago he felt like he did not needed it,and [facility] took it back. now he needs it.

## 2011-07-04 ENCOUNTER — Emergency Department: Payer: Self-pay | Admitting: Internal Medicine

## 2011-07-04 LAB — CBC
MCH: 31 pg (ref 26.0–34.0)
MCHC: 33.2 g/dL (ref 32.0–36.0)
Platelet: 289 10*3/uL (ref 150–440)
RBC: 5.12 10*6/uL (ref 4.40–5.90)
RDW: 13.3 % (ref 11.5–14.5)

## 2011-07-04 LAB — COMPREHENSIVE METABOLIC PANEL
Alkaline Phosphatase: 76 U/L (ref 50–136)
Anion Gap: 7 (ref 7–16)
Bilirubin,Total: 0.3 mg/dL (ref 0.2–1.0)
Calcium, Total: 8.8 mg/dL (ref 8.5–10.1)
Chloride: 96 mmol/L — ABNORMAL LOW (ref 98–107)
Co2: 27 mmol/L (ref 21–32)
Creatinine: 1.06 mg/dL (ref 0.60–1.30)
EGFR (African American): >60
EGFR (Non-African Amer.): >60
SGPT (ALT): 39 U/L

## 2011-07-04 LAB — CK TOTAL AND CKMB (NOT AT ARMC)
CK, Total: 72 U/L (ref 35–232)
CK-MB: 3.6 ng/mL (ref 0.5–3.6)

## 2011-07-04 LAB — PRO B NATRIURETIC PEPTIDE: B-Type Natriuretic Peptide: 490 pg/mL — ABNORMAL HIGH (ref 0–125)

## 2011-07-04 NOTE — Telephone Encounter (Signed)
Needs to be seen in our clinic Has to have documented low oxygen level 87% on RA to qualify for oxygen Can not do this over the phone

## 2011-07-06 NOTE — Telephone Encounter (Signed)
Spoke with Harriett Sine in the [facility] [employer]. They will call pt for appt. Mylo Red RN

## 2011-07-10 ENCOUNTER — Ambulatory Visit (INDEPENDENT_AMBULATORY_CARE_PROVIDER_SITE_OTHER): Payer: Medicare Other | Admitting: Cardiovascular Disease

## 2011-07-10 ENCOUNTER — Encounter: Payer: Self-pay | Admitting: Cardiovascular Disease

## 2011-07-10 VITALS — BP 165/77 | HR 90 | Ht 67.0 in | Wt 176.0 lb

## 2011-07-10 DIAGNOSIS — E785 Hyperlipidemia, unspecified: Secondary | ICD-10-CM

## 2011-07-10 DIAGNOSIS — R0602 Shortness of breath: Secondary | ICD-10-CM

## 2011-07-10 DIAGNOSIS — J449 Chronic obstructive pulmonary disease, unspecified: Secondary | ICD-10-CM

## 2011-07-10 DIAGNOSIS — I4891 Unspecified atrial fibrillation: Secondary | ICD-10-CM

## 2011-07-10 NOTE — Assessment & Plan Note (Signed)
Continues to be in atrial fibrillation, rate is mildly elevated though I suspect this is secondary to underlying COPD exacerbation.

## 2011-07-10 NOTE — Progress Notes (Signed)
Patient ID: Michael Kline, male    DOB: March 26, 1935, 77 y.o.   MRN: [id number]  HPI Comments: Michael Kline is a 77 year old gentleman with a long smoking history (stopped 2012), hypertension, severe COPD, remote history of atrial ablation in 2004 who presented to [facility] on May 16 2010 with bronchitis, atrial fibrillation with ventricular rate of 140 beats per minute, improved on rate control medication and started on pradaxa,  stopped his Pradaxa in 2012 after he was told by the [facility] that they did not cover The medication. He did not want to go on warfarin at the time.  He put himself on aspirin. He also stopped his cholesterol medication and does not want to be on a statin. He uses his oxygen PRN.   Since we have last seen him, he has been put back on warfarin. He is tolerating this well. He has recent COPD exacerbation and bronchitis and has finished his antibiotics, still completing his prednisone taper.  He uses oxygen p.r.n. And reports currently having an oxygen tank in his car for travel. He has oxygen at home that he uses him times.  He takes bystolic periodically for high blood pressure or shortness of breath.    EKG shows atrial fibrillation with ventricular rate 91 beats a minute, nonspecific ST changes in leads V5 through V6, 2, 3, aVF   Old Echocardiogram from the hospital shows ejection fraction 50-55%, mild aortic valve stenosis with mean gradient of 7 mm mercury, estimated aortic valve area of 1.5 cm, mild MR, mildly dilated left atrium, mildly elevated right ventricular systolic pressure estimated at 40-50 m of mercury      Outpatient Encounter Prescriptions as of 07/10/2011  Medication Sig Dispense Refill  . albuterol (PROVENTIL) (2.5 MG/3ML) 0.083% nebulizer solution Take 2.5 mg by nebulization every 6 (six) hours as needed. As needed        . aspirin 325 MG tablet Take 325 mg by mouth daily.        . digoxin (LANOXIN) 0.25 MG tablet Take 250 mcg by mouth daily.  As needed.      . diltiazem (CARDIZEM CD) 300 MG 24 hr capsule Take 300 mg by mouth daily.        . hydrochlorothiazide 25 MG tablet Take 25 mg by mouth daily.        . Multiple Vitamins-Minerals (EYE VITAMINS) CAPS Take 1 capsule by mouth daily.        . nebivolol (BYSTOLIC) 5 MG tablet Take 1 tablet (5 mg total) by mouth daily. As needed.  30 tablet  6  . NON FORMULARY Oxygen 2 Liters as needed.      . Omega-3 Fatty Acids (FISH OIL) 1000 MG CAPS Take 1 capsule by mouth daily.       Marland Kitchen PREDNISONE PO Take by mouth. Take 40 mg taper.      . tiotropium (SPIRIVA) 18 MCG inhalation capsule Place 18 mcg into inhaler and inhale daily.        Marland Kitchen warfarin (COUMADIN) 5 MG tablet Take as directed by anticoagulation clinic  45 tablet  3     Review of Systems  Constitutional: Negative.   HENT: Negative.   Eyes: Negative.   Respiratory: Positive for shortness of breath.   Cardiovascular: Positive for palpitations.  Gastrointestinal: Negative.   Musculoskeletal: Negative.   Skin: Negative.   Neurological: Negative.   Hematological: Negative.   Psychiatric/Behavioral: Negative.   All other systems reviewed and are negative.    BP  165/77  Pulse 90  Ht 5\' 7"  (1.702 m)  Wt 176 lb (79.833 kg)  BMI 27.57 kg/m2  Physical Exam  Nursing note and vitals reviewed. Constitutional: He is oriented to person, place, and time. He appears well-developed and well-nourished.  HENT:  Head: Normocephalic.  Nose: Nose normal.  Mouth/Throat: Oropharynx is clear and moist.  Eyes: Conjunctivae are normal. Pupils are equal, round, and reactive to light.  Neck: Normal range of motion. Neck supple. No JVD present.  Cardiovascular: Normal rate, S1 normal, S2 normal and intact distal pulses.  An irregularly irregular rhythm present. Exam reveals no gallop and no friction rub.   Murmur heard.  Crescendo systolic murmur is present with a grade of 2/6  Pulmonary/Chest: Effort normal. No respiratory distress. He has  decreased breath sounds. He has no wheezes. He has no rales. He exhibits no tenderness.  Abdominal: Soft. Bowel sounds are normal. He exhibits no distension. There is no tenderness.  Musculoskeletal: Normal range of motion. He exhibits no edema and no tenderness.  Lymphadenopathy:    He has no cervical adenopathy.  Neurological: He is alert and oriented to person, place, and time. Coordination normal.  Skin: Skin is warm and dry. No rash noted. No erythema.  Psychiatric: He has a normal mood and affect. His behavior is normal. Judgment and thought content normal.           Assessment and Plan

## 2011-07-10 NOTE — Patient Instructions (Addendum)
You are doing well. Consider trying RED YEAST RICE for cholesterol. (2 to 4 a day, sold over the counter)  Take the bystolic for high blood pressure or fast heart rate  Please call us if you have new issues that need to be addressed before your next appt.  Your physician wants you to follow-up in: 6 months.  You will receive a reminder letter in the mail two months in advance. If you don't receive a letter, please call our office to schedule the follow-up appointment.  If you need a [facility] pulmonary, call [phone]

## 2011-07-10 NOTE — Assessment & Plan Note (Signed)
Long history of smoking, stopped at the end of 2012. Recent bronchitis and COPD exacerbation, slowly improving. Still on prednisone.

## 2011-07-10 NOTE — Assessment & Plan Note (Signed)
He does not want to take a cholesterol pill. We have recommended red yeast rice. He is high risk for underlying coronary artery disease. Currently with no symptoms of chest pain.

## 2011-07-10 NOTE — Assessment & Plan Note (Signed)
Baseline shortness of breath. Likely exacerbation currently in the setting of underlying lung infection and bronchospasm.

## 2011-09-29 ENCOUNTER — Other Ambulatory Visit: Payer: Self-pay | Admitting: *Deleted

## 2011-10-26 ENCOUNTER — Ambulatory Visit: Payer: Self-pay | Admitting: Cardiovascular Disease

## 2011-10-26 DIAGNOSIS — I4891 Unspecified atrial fibrillation: Secondary | ICD-10-CM

## 2012-01-08 ENCOUNTER — Ambulatory Visit (INDEPENDENT_AMBULATORY_CARE_PROVIDER_SITE_OTHER): Payer: Medicare Other | Admitting: Cardiovascular Disease

## 2012-01-08 ENCOUNTER — Encounter: Payer: Self-pay | Admitting: Cardiovascular Disease

## 2012-01-08 VITALS — BP 132/72 | HR 78 | Ht 67.0 in | Wt 178.8 lb

## 2012-01-08 DIAGNOSIS — I4891 Unspecified atrial fibrillation: Secondary | ICD-10-CM

## 2012-01-08 DIAGNOSIS — R0602 Shortness of breath: Secondary | ICD-10-CM

## 2012-01-08 DIAGNOSIS — J4489 Other specified chronic obstructive pulmonary disease: Secondary | ICD-10-CM

## 2012-01-08 DIAGNOSIS — J449 Chronic obstructive pulmonary disease, unspecified: Secondary | ICD-10-CM

## 2012-01-08 DIAGNOSIS — E785 Hyperlipidemia, unspecified: Secondary | ICD-10-CM

## 2012-01-08 MED ORDER — NEBIVOLOL HCL 5 MG PO TABS
5.0000 mg | ORAL_TABLET | Freq: Every day | ORAL | Status: DC | PRN
Start: 2012-01-08 — End: 2013-06-21

## 2012-01-08 NOTE — Assessment & Plan Note (Signed)
Recent worsening shortness of breath. He feels this is secondary to his COPD. He has followup at the [facility] with pulmonary function test and primary care visit in the next several weeks.

## 2012-01-08 NOTE — Assessment & Plan Note (Signed)
Rate is relatively well controlled. He takes bystolic 5 mg as needed.

## 2012-01-08 NOTE — Progress Notes (Signed)
Patient ID: Michael Kline, male    DOB: 03-31-1935, 77 y.o.   MRN: [id number]  HPI Comments: Michael Kline is a 77 year old gentleman with a long smoking history (stopped 2012), hypertension, severe COPD, remote history of atrial fibrillation in 2004 who presented to [facility] on May 16 2010 with bronchitis, atrial fibrillation with ventricular rate of 140 beats per minute, improved on rate control medication and started on pradaxa,  stopped his Pradaxa in 2012 after he was told by the [facility] that they did not cover the medication. He did not want to go on warfarin at the time.  He put himself on aspirin. He also stopped his cholesterol medication and does not want to be on a statin. He uses his oxygen PRN. He reports having a 3 cm AAA. Last ultrasound possibly 2011  Since we have last seen him, he has been put back on warfarin. He is tolerating this well. Previous COPD exacerbation and bronchitis requiring antibiotics,  prednisone taper.  He uses oxygen p.r.n. And reports currently having an oxygen tank in his car for travel. He has oxygen at home that he uses at times.  He takes bystolic periodically for high blood pressure or shortness of breath.    Sees Pulmonary at the [place]. PFTs on Sept 11th 2013 He has had some worsening shortness of breath over the past month. Yesterday he was gardening, weedeating. Felt okay. No significant lower extremity edema. No worsening cough apart from his baseline cough. He reports having a tightness in his chest every morning and takes a herbal over-the-counter medicine which seems to help. He does have several inhalers including Combivent, Advair, Spiriva. These are supplied by the [place].   EKG shows atrial fibrillation with ventricular rate 91 beats a minute, nonspecific ST changes in leads V5 through V6, 2, 3, aVF   Previous Echocardiogram from the hospital shows ejection fraction 50-55%, mild aortic valve stenosis with mean gradient of 7 mm mercury,  estimated aortic valve area of 1.5 cm, mild MR, mildly dilated left atrium, mildly elevated right ventricular systolic pressure estimated at 40-50 m of mercury  EKG shows a atrial fibrillation with heart rate in the 70s      Outpatient Encounter Prescriptions as of 01/08/2012  Medication Sig Dispense Refill  . aspirin 325 MG tablet Take 325 mg by mouth daily.        . digoxin (LANOXIN) 0.25 MG tablet Take 250 mcg by mouth daily. As needed.      . diltiazem (CARDIZEM CD) 300 MG 24 hr capsule Take 300 mg by mouth daily.        . furosemide (LASIX) 20 MG tablet Take 20 mg by mouth daily as needed.      . Ipratropium-Albuterol (COMBIVENT) 20-100 MCG/ACT AERS respimat Inhale 1 puff into the lungs 4 (four) times daily.      . Multiple Vitamins-Minerals (EYE VITAMINS) CAPS Take 1 capsule by mouth daily.        . nebivolol (BYSTOLIC) 5 MG tablet Take 1 tablet (5 mg total) by mouth daily as needed. As needed.  30 tablet  6  . Omega-3 Fatty Acids (FISH OIL) 1000 MG CAPS Take 1 capsule by mouth daily.       Marland Kitchen warfarin (COUMADIN) 5 MG tablet Take as directed by anticoagulation clinic  45 tablet  3    Review of Systems  Constitutional: Negative.   HENT: Negative.   Eyes: Negative.   Respiratory: Positive for shortness of breath.  Gastrointestinal: Negative.   Musculoskeletal: Negative.   Skin: Negative.   Neurological: Negative.   Hematological: Negative.   Psychiatric/Behavioral: Negative.   All other systems reviewed and are negative.   BP 132/72  Pulse 78  Ht 5\' 7"  (1.702 m)  Wt 178 lb 12 oz (81.08 kg)  BMI 28.00 kg/m2  Physical Exam  Nursing note and vitals reviewed. Constitutional: He is oriented to person, place, and time. He appears well-developed and well-nourished.  HENT:  Head: Normocephalic.  Nose: Nose normal.  Mouth/Throat: Oropharynx is clear and moist.  Eyes: Conjunctivae are normal. Pupils are equal, round, and reactive to light.  Neck: Normal range of motion. Neck  supple. No JVD present.  Cardiovascular: Normal rate, S1 normal, S2 normal and intact distal pulses.  An irregularly irregular rhythm present. Exam reveals no gallop and no friction rub.   Murmur heard.  Crescendo systolic murmur is present with a grade of 2/6  Pulmonary/Chest: Effort normal. No respiratory distress. He has decreased breath sounds. He has no wheezes. He has no rales. He exhibits no tenderness.  Abdominal: Soft. Bowel sounds are normal. He exhibits no distension. There is no tenderness.  Musculoskeletal: Normal range of motion. He exhibits no edema and no tenderness.  Lymphadenopathy:    He has no cervical adenopathy.  Neurological: He is alert and oriented to person, place, and time. Coordination normal.  Skin: Skin is warm and dry. No rash noted. No erythema.  Psychiatric: He has a normal mood and affect. His behavior is normal. Judgment and thought content normal.           Assessment and Plan

## 2012-01-08 NOTE — Assessment & Plan Note (Signed)
He does not want a statin 

## 2012-01-08 NOTE — Patient Instructions (Addendum)
You are doing well. No medication changes were made.  Call the office if your shortness of breath does not get better. It could be the heart. [facility].  Please call us if you have new issues that need to be addressed before your next appt.  Your physician wants you to follow-up in: 6 months.  You will receive a reminder letter in the mail two months in advance. If you don't receive a letter, please call our office to schedule the follow-up appointment.

## 2012-01-08 NOTE — Assessment & Plan Note (Signed)
Symptoms likely from COPD. If sx persist, I have suggested he call our office for cardiac evaluation. He may have underlying ischemia

## 2012-05-23 ENCOUNTER — Inpatient Hospital Stay: Payer: Self-pay | Admitting: Internal Medicine

## 2012-05-23 LAB — BASIC METABOLIC PANEL
Anion Gap: 5 — ABNORMAL LOW (ref 7–16)
BUN: 18 mg/dL (ref 7–18)
EGFR (Non-African Amer.): >60
Glucose: 115 mg/dL — ABNORMAL HIGH (ref 65–99)
Osmolality: 269 (ref 275–301)
Potassium: 4.3 mmol/L (ref 3.5–5.1)
Sodium: 133 mmol/L — ABNORMAL LOW (ref 136–145)

## 2012-05-23 LAB — CBC
HCT: 34.1 % — ABNORMAL LOW (ref 40.0–52.0)
HGB: 11.5 g/dL — ABNORMAL LOW (ref 13.0–18.0)
MCH: 30.6 pg (ref 26.0–34.0)
MCHC: 33.8 g/dL (ref 32.0–36.0)
MCV: 91 fL (ref 80–100)
RBC: 3.77 10*6/uL — ABNORMAL LOW (ref 4.40–5.90)
WBC: 10.3 10*3/uL (ref 3.8–10.6)

## 2012-05-23 LAB — RAPID INFLUENZA A&B ANTIGENS

## 2012-05-24 LAB — CBC WITH DIFFERENTIAL/PLATELET
Basophil %: 0.2 %
Eosinophil #: 0 10*3/uL (ref 0.0–0.7)
Eosinophil %: 0 %
Lymphocyte #: 0.5 10*3/uL — ABNORMAL LOW (ref 1.0–3.6)
MCH: 30.7 pg (ref 26.0–34.0)
MCHC: 34.1 g/dL (ref 32.0–36.0)
MCV: 90 fL (ref 80–100)
Monocyte #: 0.1 x10 3/mm — ABNORMAL LOW (ref 0.2–1.0)
Neutrophil %: 92.5 %
Platelet: 323 10*3/uL (ref 150–440)
RBC: 3.6 10*6/uL — ABNORMAL LOW (ref 4.40–5.90)
WBC: 8.8 10*3/uL (ref 3.8–10.6)

## 2012-05-24 LAB — PROTIME-INR: INR: 3

## 2012-05-25 LAB — CBC WITH DIFFERENTIAL/PLATELET
Basophil #: 0 10*3/uL (ref 0.0–0.1)
Basophil %: 0.1 %
Eosinophil #: 0 10*3/uL (ref 0.0–0.7)
Eosinophil %: 0 %
HCT: 32.2 % — ABNORMAL LOW (ref 40.0–52.0)
HGB: 10.5 g/dL — ABNORMAL LOW (ref 13.0–18.0)
MCV: 91 fL (ref 80–100)
Monocyte #: 0.4 x10 3/mm (ref 0.2–1.0)
Neutrophil %: 93.9 %
WBC: 17.3 10*3/uL — ABNORMAL HIGH (ref 3.8–10.6)

## 2012-05-25 LAB — PROTIME-INR
INR: 3.5
Prothrombin Time: 35.4 secs — ABNORMAL HIGH (ref 11.5–14.7)

## 2012-05-25 LAB — BASIC METABOLIC PANEL
Anion Gap: 9 (ref 7–16)
Chloride: 98 mmol/L (ref 98–107)
Co2: 26 mmol/L (ref 21–32)
Creatinine: 0.96 mg/dL (ref 0.60–1.30)
EGFR (African American): >60
EGFR (Non-African Amer.): >60
Glucose: 173 mg/dL — ABNORMAL HIGH (ref 65–99)
Osmolality: 272 (ref 275–301)
Potassium: 4 mmol/L (ref 3.5–5.1)
Sodium: 133 mmol/L — ABNORMAL LOW (ref 136–145)

## 2012-05-26 LAB — PROTIME-INR: INR: 2.8

## 2012-05-26 LAB — WBC: WBC: 17 10*3/uL — ABNORMAL HIGH (ref 3.8–10.6)

## 2012-05-29 LAB — CULTURE, BLOOD (SINGLE)

## 2012-12-10 IMAGING — CT CT CHEST W/ CM
2 series · 15 of 31 positions shown, 19 images · IV contrast (APPLIED)
Comparison: none

REASON FOR EXAM: dyspnea  hypoxemia
COMMENTS:

PROCEDURE:     CT  - CT CHEST (FOR PE) W  - May 15, 2010 [DATE]
RESULT:     Comparison: None
TECHNIQUE: Multiple thin section axial images were obtained from the lung
apices to the upper abdomen following 100 ml Isovue 370 intravenous
contrast, according to the PE protocol. These images were also reviewed on a
[employer] multiplanar work station.

[Series 4: soft tissue · axial · 0.74mm/px · z∈[-178,-130]mm · 2 of 107 slices shown]
[im 9/107  mediastinal]
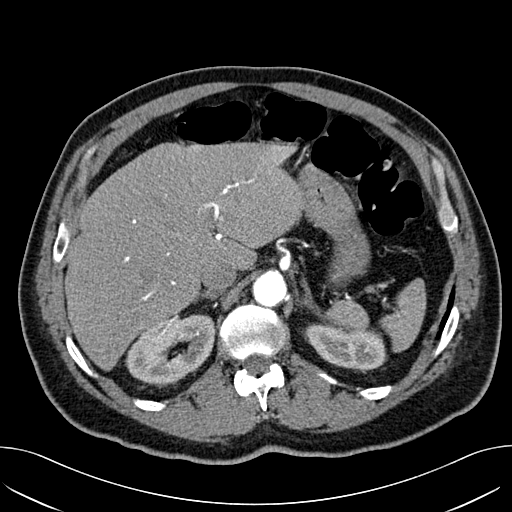
[im 25/107  mediastinal]
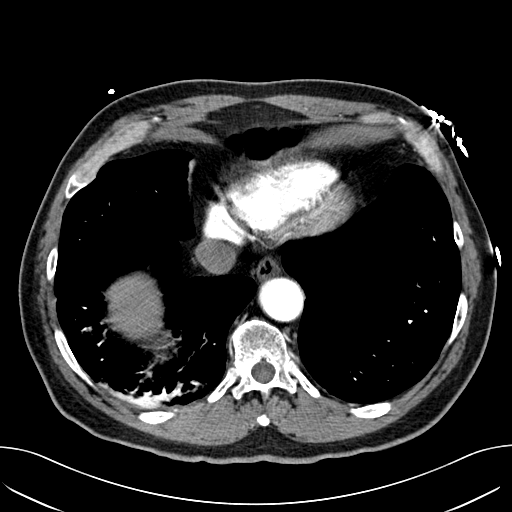

[Series 5: lung windows · axial · 0.74mm/px · z∈[-172,+92]mm · 13 of 104 slices shown, 17 images]
[im 8/104  mediastinal]
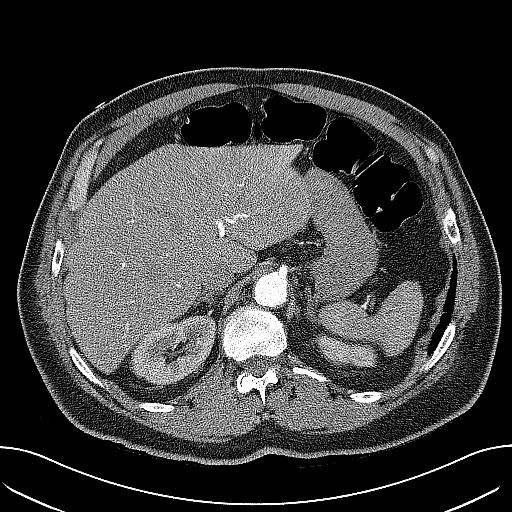
[im 8/104  lung]
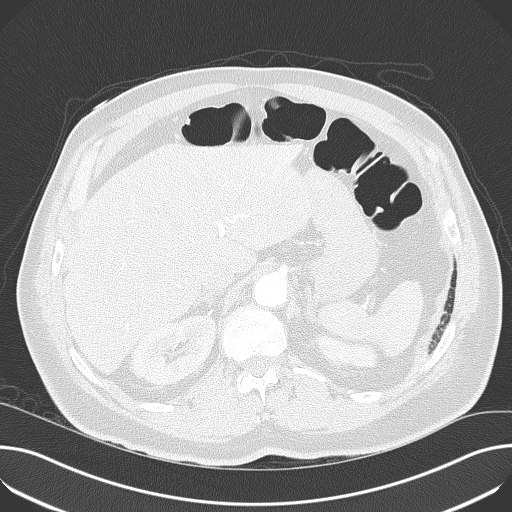
[im 16/104  lung]
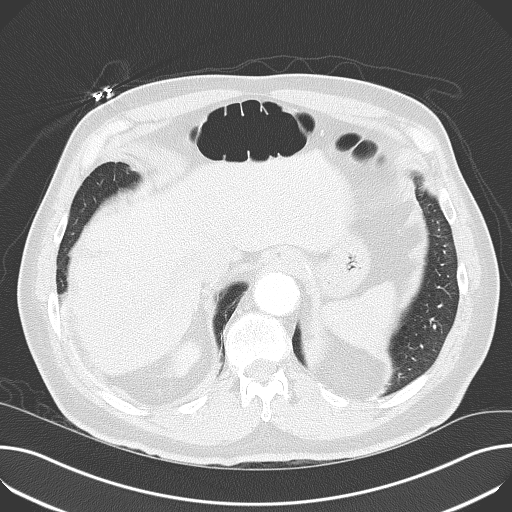
[im 24/104  lung]
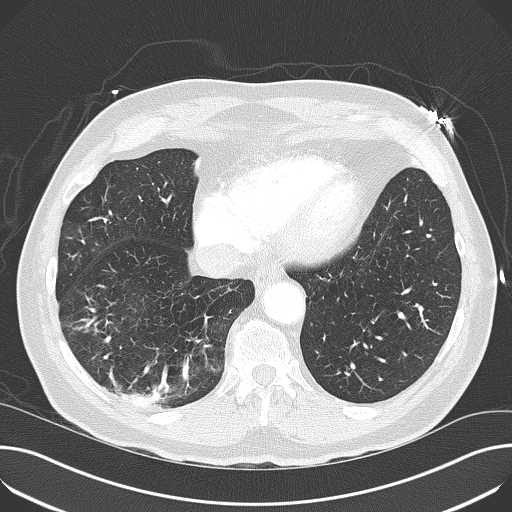
[im 32/104  lung]
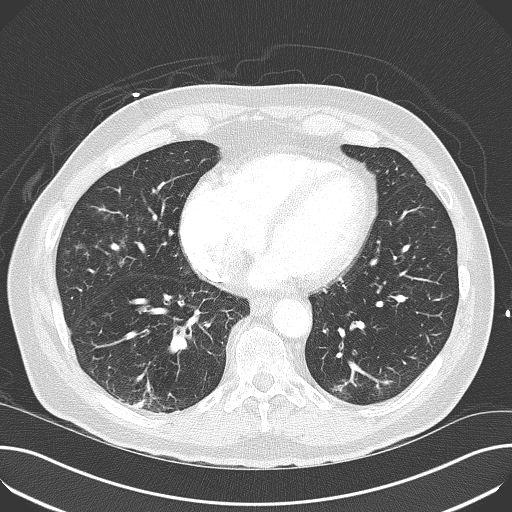
[im 40/104  mediastinal]
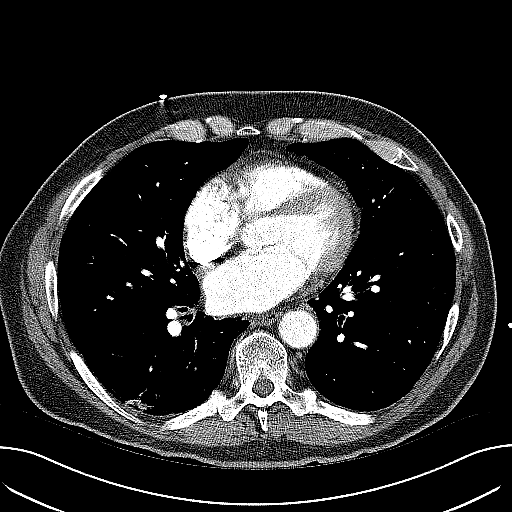
[im 40/104  lung]
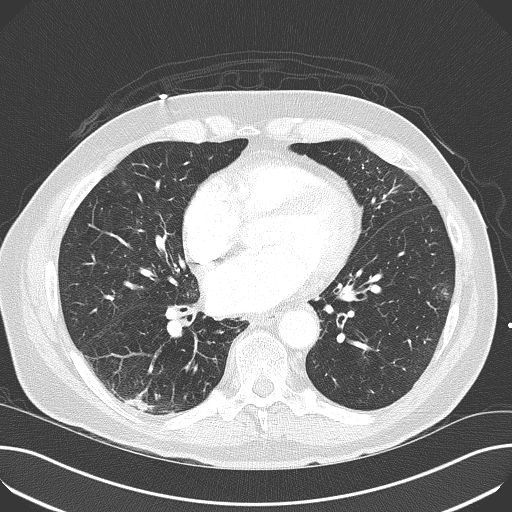
[im 48/104  lung]
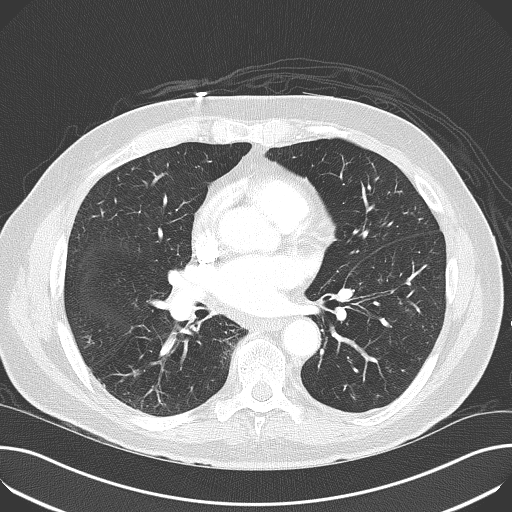
[im 52/104  lung]
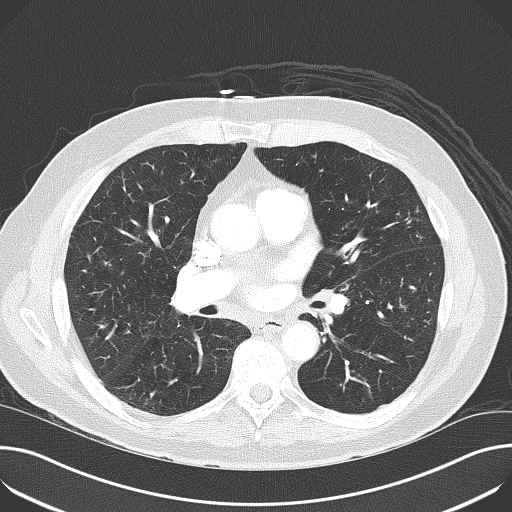
[im 56/104  lung]
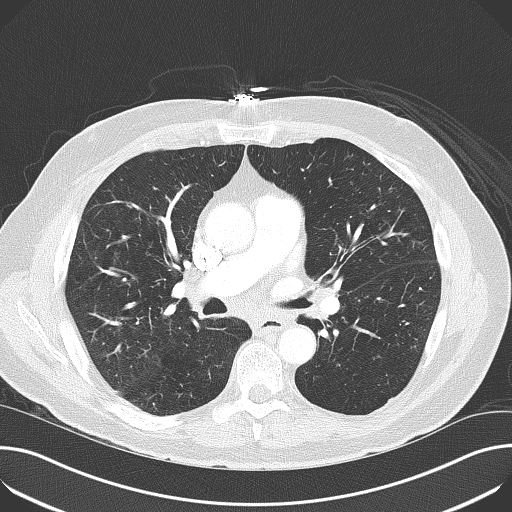
[im 64/104  mediastinal]
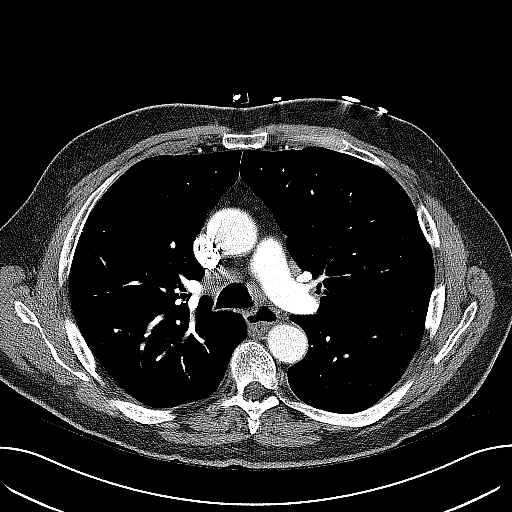
[im 64/104  lung]
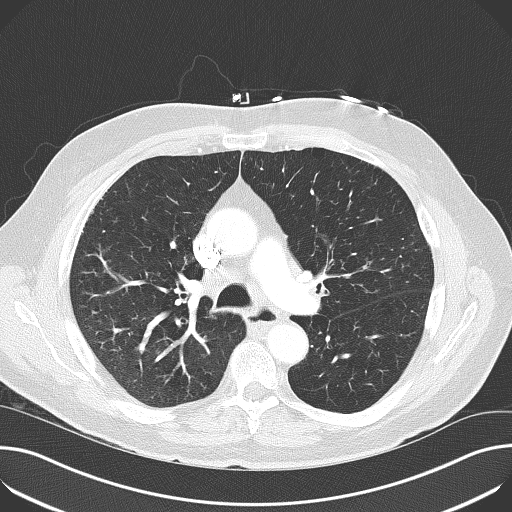
[im 72/104  lung]
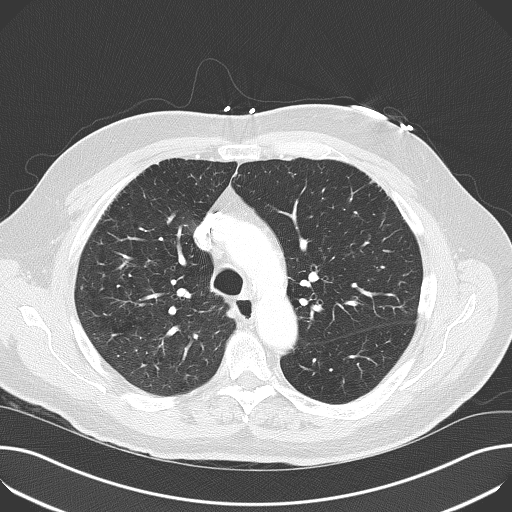
[im 80/104  lung]
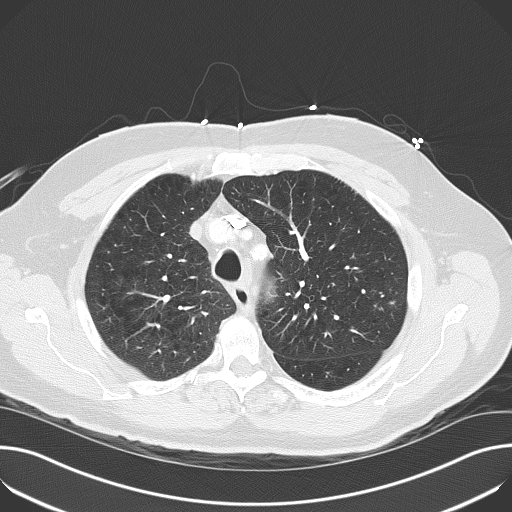
[im 88/104  lung]
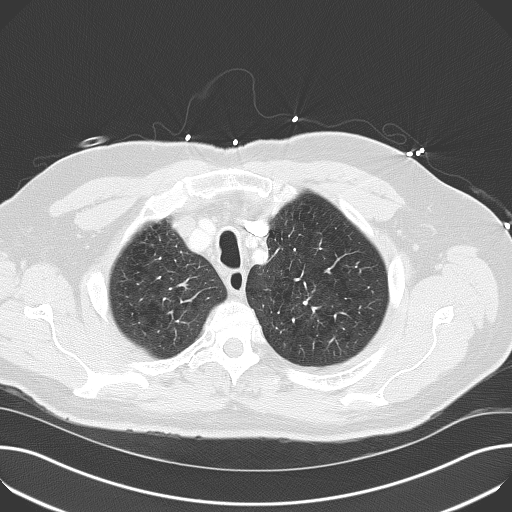
[im 96/104  mediastinal]
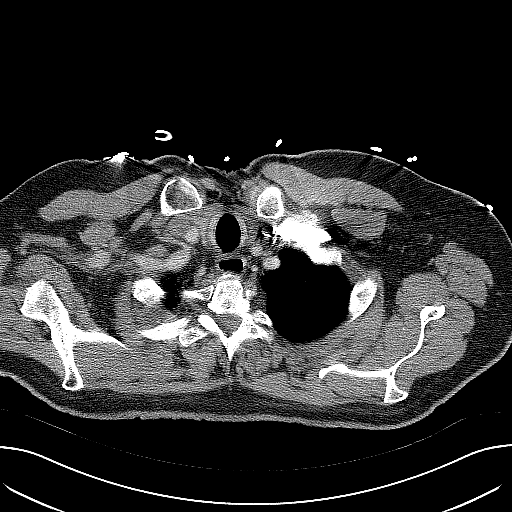
[im 96/104  lung]
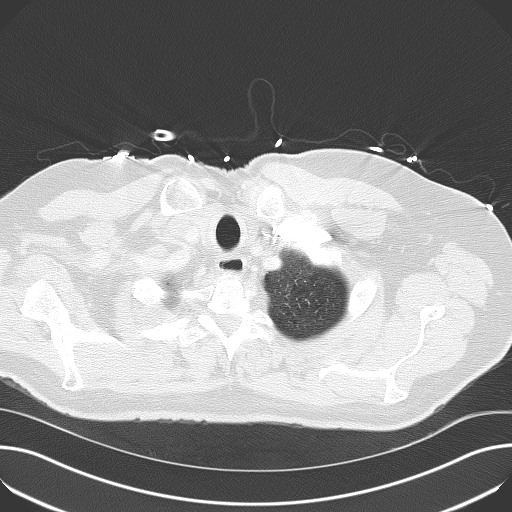

[15 of 31 positions shown; findings below may reference images not displayed]

FINDINGS: No mediastinal, hilar, or axillary lymphadenopathy. There are vascular
calcifications in the thoracic aorta. The no pulmonary embolus identified.
There is minimal atelectasis at the right lung base. There are scattered
ground glass opacities in the right lower lobe and right middle lobe and to
a lesser extent the remainder of the lungs. There is mild bronchial wall
thickening. There is mild centrilobular emphysema.

No aggressive lytic or sclerotic osseous lesions.
IMPRESSION: 1. No pulmonary embolus identified.
2. Scattered ground glass opacities which likely represent infection or
inflammation, including atypical infection.

## 2012-12-10 IMAGING — CR DG CHEST 2V
1 series · 3 of 3 positions shown · non-contrast
Comparison: none

REASON FOR EXAM: cough, chest congestion, COPD
COMMENTS:

[Series 1: view not recorded · 0.17mm/px · 3 of 3 slices shown]
[im 1/3]
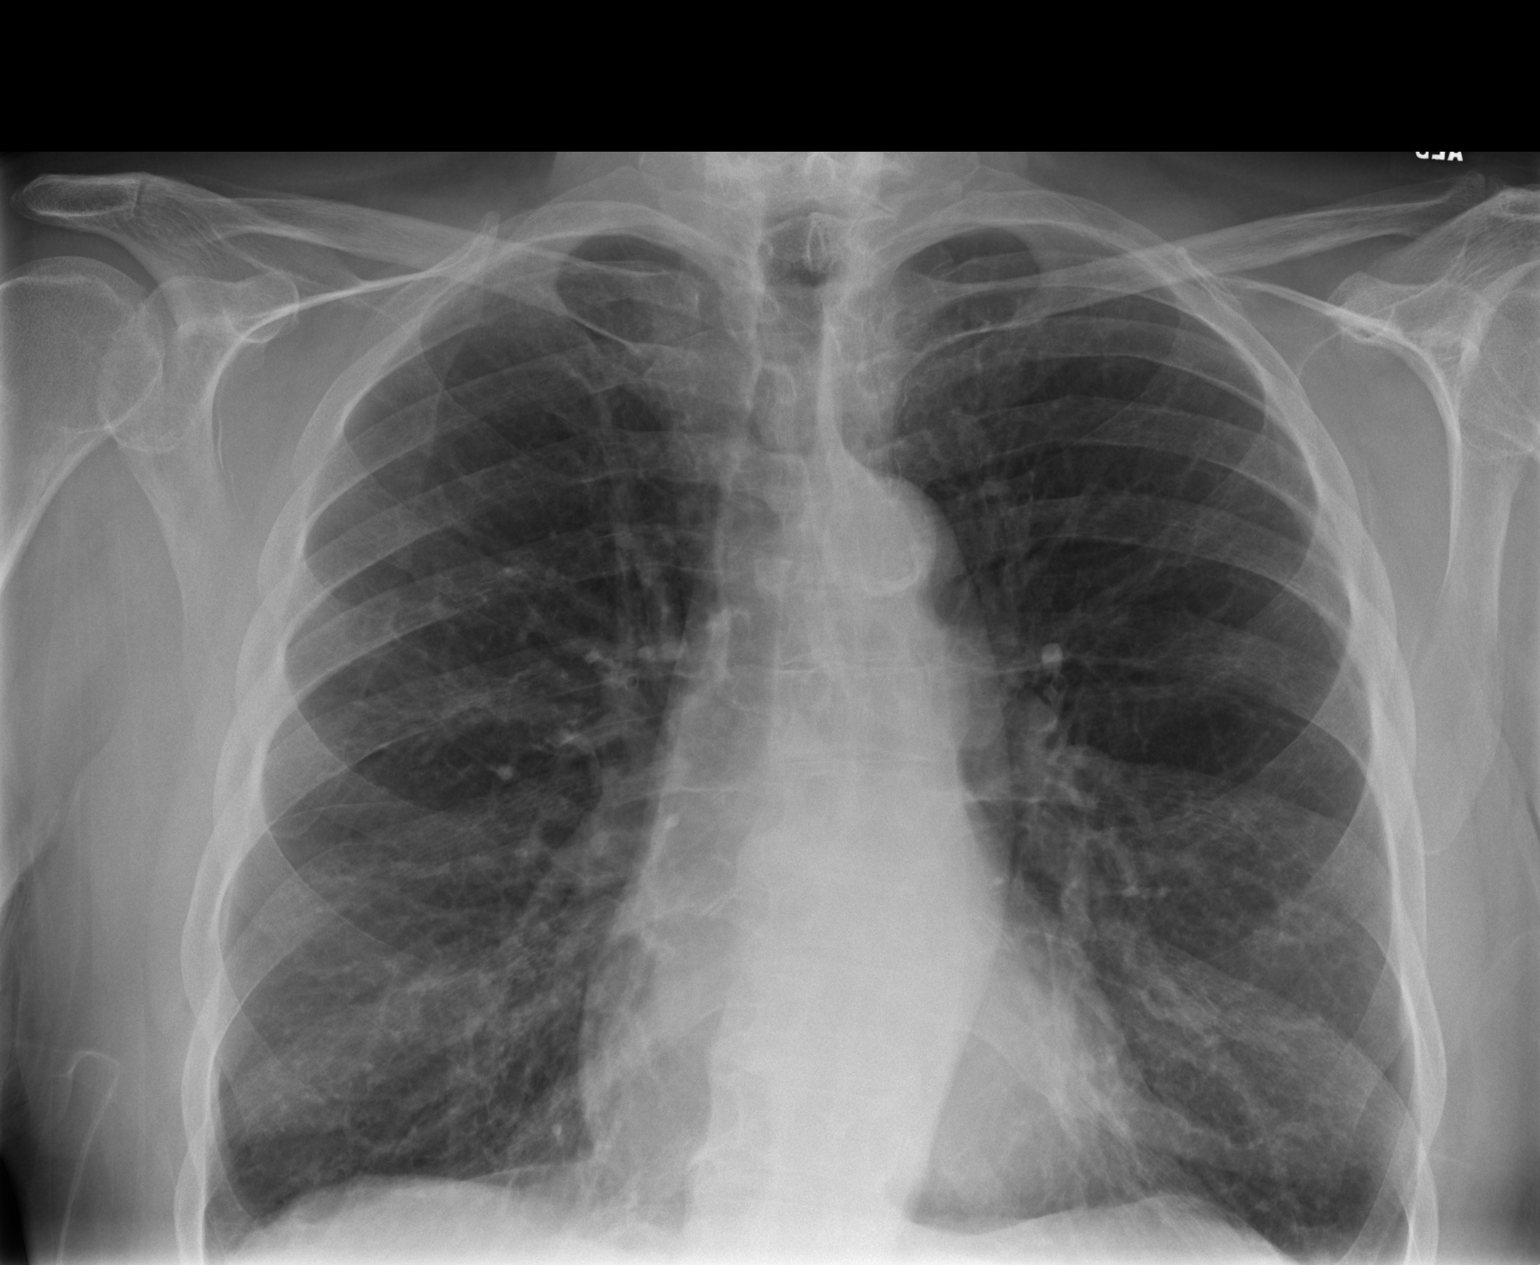
[im 2/3]
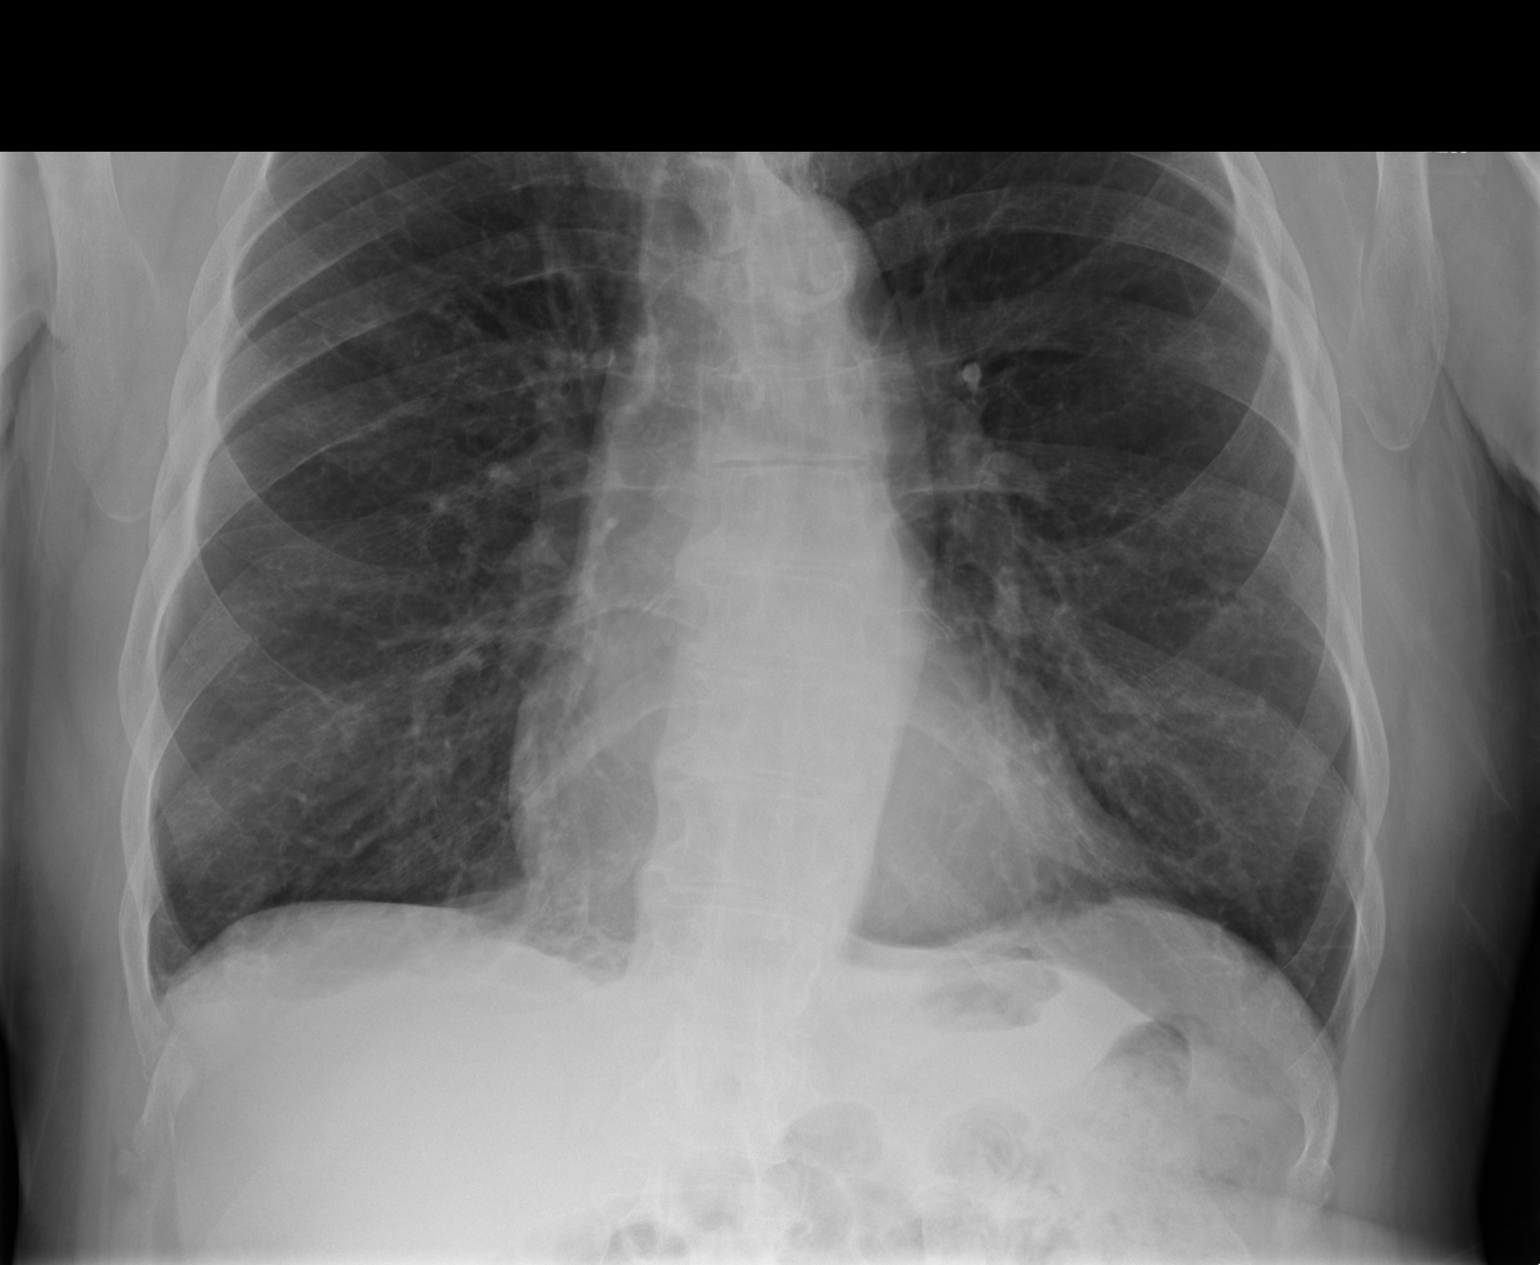
[im 3/3]
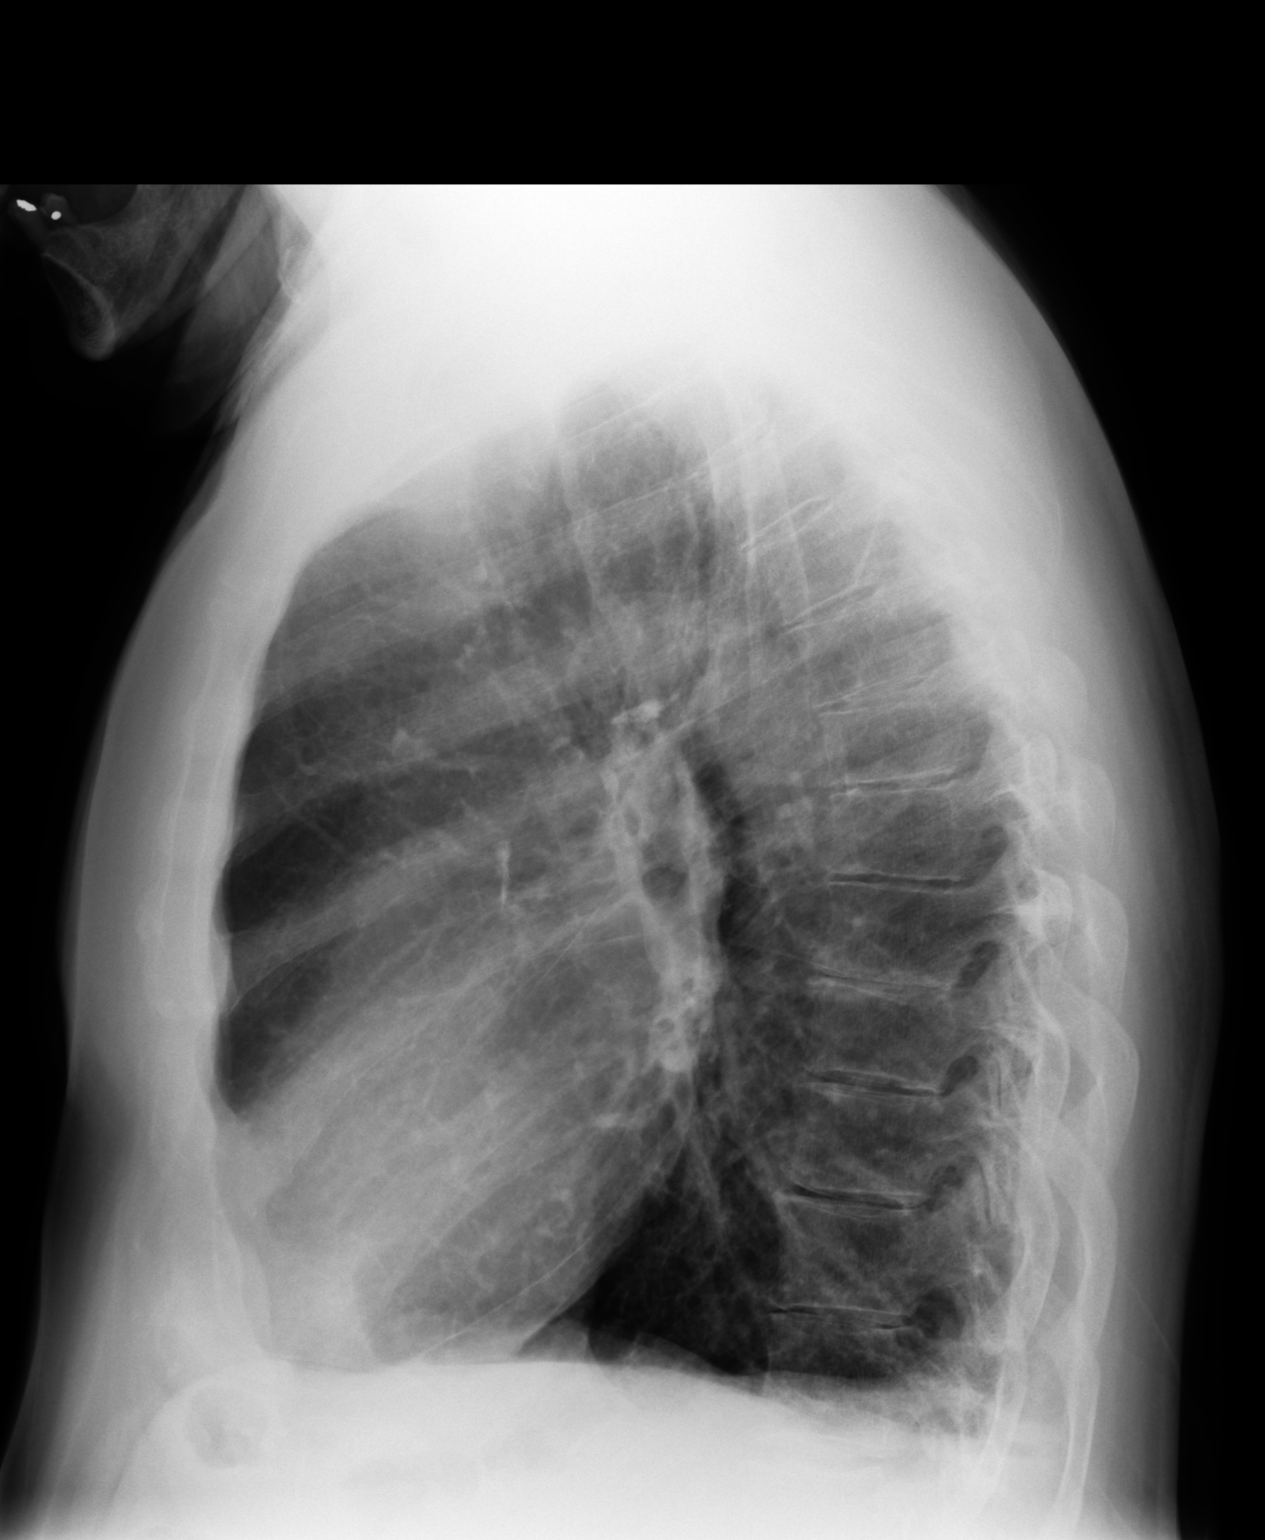

[3 of 3 positions shown; findings below may reference images not displayed]

PROCEDURE:     MDR - MDR CHEST PA(OR AP) AND LATERAL  - May 15, 2010  [DATE]

RESULT:     Comparison is made to the exam of 08/24/2008.

There is mild hyperinflation consistent with COPD. The lungs are clear. The
heart and pulmonary vessels are normal. The bony and mediastinal structures
are unremarkable. There is no effusion. There is no pneumothorax or evidence
of congestive failure.
IMPRESSION: No acute cardiopulmonary disease. COPD changes are noted.

## 2013-06-21 ENCOUNTER — Encounter: Payer: Self-pay | Admitting: Cardiovascular Disease

## 2013-06-21 ENCOUNTER — Ambulatory Visit (INDEPENDENT_AMBULATORY_CARE_PROVIDER_SITE_OTHER): Payer: Medicare Other | Admitting: Cardiovascular Disease

## 2013-06-21 VITALS — BP 134/74 | HR 78 | Ht 67.0 in | Wt 181.5 lb

## 2013-06-21 DIAGNOSIS — R0602 Shortness of breath: Secondary | ICD-10-CM

## 2013-06-21 DIAGNOSIS — J449 Chronic obstructive pulmonary disease, unspecified: Secondary | ICD-10-CM

## 2013-06-21 DIAGNOSIS — M79606 Pain in leg, unspecified: Secondary | ICD-10-CM

## 2013-06-21 DIAGNOSIS — I739 Peripheral vascular disease, unspecified: Secondary | ICD-10-CM

## 2013-06-21 DIAGNOSIS — I4891 Unspecified atrial fibrillation: Secondary | ICD-10-CM

## 2013-06-21 DIAGNOSIS — M79609 Pain in unspecified limb: Secondary | ICD-10-CM

## 2013-06-21 DIAGNOSIS — E785 Hyperlipidemia, unspecified: Secondary | ICD-10-CM

## 2013-06-21 NOTE — Patient Instructions (Addendum)
You are doing well. Please change aspirin to 81 mg daily  We will order ABIs to look for  Blockage in the legs  Please call us if you have new issues that need to be addressed before your next appt.  Your physician wants you to follow-up in: 6 months.  You will receive a reminder letter in the mail two months in advance. If you don't receive a letter, please call our office to schedule the follow-up appointment.

## 2013-06-21 NOTE — Assessment & Plan Note (Signed)
Heart rate relatively well-controlled. We'll continue him on calcium channel blocker. He stopped beta blocker on his own and does not want to restart this at this time

## 2013-06-21 NOTE — Assessment & Plan Note (Signed)
He does not want a cholesterol medication

## 2013-06-21 NOTE — Assessment & Plan Note (Signed)
Suspect most of his shortness of breath is from underlying COPD. Appears relatively euvolemic. Not taking Lasix

## 2013-06-21 NOTE — Assessment & Plan Note (Signed)
We will order ankle-brachial indexes for right leg claudication

## 2013-06-21 NOTE — Progress Notes (Signed)
Patient ID: Michael Kline, male    DOB: 04-26-1935, 79 y.o.   MRN: [id number]  HPI Comments: Michael Kline is a 79 year old gentleman with a long smoking history (stopped 2012), hypertension, severe COPD, remote history of atrial fibrillation in 2004 who presented to [facility] on May 16 2010 with bronchitis, atrial fibrillation with ventricular rate of 140 beats per minute, improved on rate control medication and started on pradaxa,  stopped his Pradaxa in 2012 after he was told by the [facility] that they did not cover the medication. He did not want to go on warfarin at the time.  He put himself on aspirin. He also stopped his cholesterol medication and does not want to be on a statin. He uses his oxygen PRN. He reports having a 3 cm AAA. Last ultrasound possibly 2011  In followup today, he reports that he is doing well. He is tolerating warfarin. He does use his oxygen at home as needed. Not on a regular basis. He does report having worsening cramping in his right lower extremity with exertion such as walking over the past year. He does not want a statin. Also reports having some shortness of breath with exertion which has been chronic. Some days able to walk long distances, other days shortness of breath with short distances. He attributes this to his atrial fibrillation and COPD. He has stopped the beta blocker on his own. Also not taking Lasix   Sees Pulmonary at the [place]. PFTs on Sept 11th 2013 He does have several inhalers including Combivent, Advair, Spiriva. These are supplied by the [place].  Only really takes Combivent (" a little too much")  Previous Echocardiogram from the hospital shows ejection fraction 50-55%, mild aortic valve stenosis with mean gradient of 7 mm mercury, estimated aortic valve area of 1.5 cm, mild MR, mildly dilated left atrium, mildly elevated right ventricular systolic pressure estimated at 40-50 m of mercury  EKG shows a atrial fibrillation with heart rate of 78  beats per minute, nonspecific ST abnormality      Outpatient Encounter Prescriptions as of 06/21/2013  Medication Sig  . aspirin 325 MG tablet Take 325 mg by mouth daily.    . digoxin (LANOXIN) 0.25 MG tablet Take 250 mcg by mouth daily. As needed.  . diltiazem (CARDIZEM CD) 300 MG 24 hr capsule Take 300 mg by mouth daily.    . Ipratropium-Albuterol (COMBIVENT) 20-100 MCG/ACT AERS respimat Inhale 2 puffs into the lungs 4 (four) times daily.   . Multiple Vitamins-Minerals (EYE VITAMINS) CAPS Take 1 capsule by mouth daily.    . Omega-3 Fatty Acids (FISH OIL) 1000 MG CAPS Take 1 capsule by mouth daily.   Marland Kitchen PROAIR HFA 108 (90 BASE) MCG/ACT inhaler Inhale 1 puff into the lungs every 6 (six) hours as needed.   . warfarin (COUMADIN) 5 MG tablet Take as directed by anticoagulation clinic    Review of Systems  Constitutional: Negative.   HENT: Negative.   Eyes: Negative.   Respiratory: Positive for cough and shortness of breath.   Cardiovascular: Negative.   Gastrointestinal: Negative.   Endocrine: Negative.   Musculoskeletal: Negative.   Skin: Negative.   Allergic/Immunologic: Negative.   Neurological: Negative.   Hematological: Negative.   Psychiatric/Behavioral: Negative.   All other systems reviewed and are negative.   BP 134/74  Pulse 78  Ht 5\' 7"  (1.702 m)  Wt 181 lb 8 oz (82.328 kg)  BMI 28.42 kg/m2  Physical Exam  Nursing note and vitals reviewed.  Constitutional: He is oriented to person, place, and time. He appears well-developed and well-nourished.  HENT:  Head: Normocephalic.  Nose: Nose normal.  Mouth/Throat: Oropharynx is clear and moist.  Eyes: Conjunctivae are normal. Pupils are equal, round, and reactive to light.  Neck: Normal range of motion. Neck supple. No JVD present.  Cardiovascular: Normal rate, S1 normal, S2 normal and intact distal pulses.  An irregularly irregular rhythm present. Exam reveals no gallop and no friction rub.   Murmur heard.  Crescendo  systolic murmur is present with a grade of 2/6  Pulmonary/Chest: Effort normal. No respiratory distress. He has decreased breath sounds. He has no wheezes. He has no rales. He exhibits no tenderness.  Abdominal: Soft. Bowel sounds are normal. He exhibits no distension. There is no tenderness.  Musculoskeletal: Normal range of motion. He exhibits no edema and no tenderness.  Lymphadenopathy:    He has no cervical adenopathy.  Neurological: He is alert and oriented to person, place, and time. Coordination normal.  Skin: Skin is warm and dry. No rash noted. No erythema.  Psychiatric: He has a normal mood and affect. His behavior is normal. Judgment and thought content normal.      Assessment and Plan

## 2013-06-21 NOTE — Assessment & Plan Note (Signed)
Chronic COPD. Only on Combivent. Is not taking other inhalers

## 2013-06-23 ENCOUNTER — Other Ambulatory Visit (HOSPITAL_COMMUNITY): Payer: Self-pay | Admitting: *Deleted

## 2013-06-23 DIAGNOSIS — I70219 Atherosclerosis of native arteries of extremities with intermittent claudication, unspecified extremity: Secondary | ICD-10-CM

## 2013-06-29 ENCOUNTER — Other Ambulatory Visit (HOSPITAL_COMMUNITY): Payer: Self-pay | Admitting: *Deleted

## 2013-06-29 ENCOUNTER — Encounter (INDEPENDENT_AMBULATORY_CARE_PROVIDER_SITE_OTHER): Payer: Medicare Other

## 2013-06-29 DIAGNOSIS — I70219 Atherosclerosis of native arteries of extremities with intermittent claudication, unspecified extremity: Secondary | ICD-10-CM

## 2013-06-29 DIAGNOSIS — I739 Peripheral vascular disease, unspecified: Secondary | ICD-10-CM

## 2013-07-20 ENCOUNTER — Ambulatory Visit: Payer: Medicare Other | Admitting: Cardiovascular Disease

## 2013-07-31 ENCOUNTER — Encounter: Payer: Self-pay | Admitting: Cardiovascular Disease

## 2013-07-31 ENCOUNTER — Encounter: Payer: Self-pay | Admitting: *Deleted

## 2013-07-31 ENCOUNTER — Ambulatory Visit (INDEPENDENT_AMBULATORY_CARE_PROVIDER_SITE_OTHER): Payer: Medicare Other | Admitting: Cardiovascular Disease

## 2013-07-31 ENCOUNTER — Encounter (HOSPITAL_COMMUNITY): Payer: Self-pay | Admitting: Pharmacy Technician

## 2013-07-31 ENCOUNTER — Ambulatory Visit: Payer: Self-pay | Admitting: Cardiovascular Disease

## 2013-07-31 ENCOUNTER — Other Ambulatory Visit: Payer: Self-pay

## 2013-07-31 VITALS — BP 147/84 | HR 92 | Ht 67.0 in | Wt 177.2 lb

## 2013-07-31 DIAGNOSIS — I25810 Atherosclerosis of coronary artery bypass graft(s) without angina pectoris: Secondary | ICD-10-CM

## 2013-07-31 DIAGNOSIS — E785 Hyperlipidemia, unspecified: Secondary | ICD-10-CM

## 2013-07-31 DIAGNOSIS — I739 Peripheral vascular disease, unspecified: Secondary | ICD-10-CM | POA: Insufficient documentation

## 2013-07-31 DIAGNOSIS — I4891 Unspecified atrial fibrillation: Secondary | ICD-10-CM

## 2013-07-31 LAB — CBC WITH DIFFERENTIAL/PLATELET
BASOS PCT: 0.8 %
Basophil #: 0.1 10*3/uL (ref 0.0–0.1)
EOS ABS: 0.7 10*3/uL (ref 0.0–0.7)
Eosinophil %: 7.5 %
HCT: 45.3 % (ref 40.0–52.0)
HGB: 15.4 g/dL (ref 13.0–18.0)
LYMPHS PCT: 12.7 %
Lymphocyte #: 1.1 10*3/uL (ref 1.0–3.6)
MCH: 32.5 pg (ref 26.0–34.0)
MCHC: 34.1 g/dL (ref 32.0–36.0)
MCV: 95 fL (ref 80–100)
MONO ABS: 0.9 x10 3/mm (ref 0.2–1.0)
Monocyte %: 10.4 %
NEUTROS ABS: 6.1 10*3/uL (ref 1.4–6.5)
NEUTROS PCT: 68.6 %
Platelet: 212 10*3/uL (ref 150–440)
RBC: 4.75 10*6/uL (ref 4.40–5.90)
RDW: 13.2 % (ref 11.5–14.5)
WBC: 8.8 10*3/uL (ref 3.8–10.6)

## 2013-07-31 NOTE — Assessment & Plan Note (Signed)
Ventricular rate is controlled on medications.

## 2013-07-31 NOTE — Assessment & Plan Note (Addendum)
Mr. Morlock has severe right calf claudication which has progressed to rest pain recently. Onset a subacute over the last few months. Noninvasive evaluation showed an ABI of 0.74 on the right side and normal on the left side. This is probably underestimating the severity of his disease. By duplex, the distal SFA was not well-visualized but likely occluded. Due to severity of his symptoms, I recommend proceeding with abdominal aortogram, lower extremity runoff and possible angioplasty. Hold warfarin 5 days before the procedure. If endovascular intervention is performed, I will plan on treating him with Plavix and warfarin without aspirin. Risks, benefits and alternatives were discussed with the patient.

## 2013-07-31 NOTE — Progress Notes (Signed)
Primary cardiologist: Dr. Gollan  HPI  Michael Kline is a 79-year-old gentleman who was referred by Dr. Gollan for evaluation and management of peripheral arterial disease.  He has a long smoking history (stopped 2012), hypertension, severe COPD, small abdominal aortic aneurysm and chronic atrial fibrillation. He is on long-term anticoagulation with warfarin. He has chronic dyspnea related to COPD and denies any chest pain. He started having right calf discomfort with walking a few months ago and this has been worsening since then. He is now having discomfort in the right calf at that. This has limited his ability to walk significantly. There is no lower extremity ulceration. There is mild discomfort affecting the left leg. He has no history of diabetes or coronary artery disease.   Allergies  Allergen Reactions  . Hydrocodone-Acetaminophen      Current Outpatient Prescriptions on File Prior to Visit  Medication Sig Dispense Refill  . aspirin 325 MG tablet Take 325 mg by mouth daily.        . digoxin (LANOXIN) 0.25 MG tablet Take 250 mcg by mouth daily. As needed.      . diltiazem (CARDIZEM CD) 300 MG 24 hr capsule Take 300 mg by mouth daily.        . Ipratropium-Albuterol (COMBIVENT) 20-100 MCG/ACT AERS respimat Inhale 2 puffs into the lungs 4 (four) times daily.       . Multiple Vitamins-Minerals (EYE VITAMINS) CAPS Take 1 capsule by mouth daily.        . Omega-3 Fatty Acids (FISH OIL) 1000 MG CAPS Take 1 capsule by mouth daily.       . PROAIR HFA 108 (90 BASE) MCG/ACT inhaler Inhale 1 puff into the lungs every 6 (six) hours as needed.       . warfarin (COUMADIN) 5 MG tablet Take as directed by anticoagulation clinic  45 tablet  3   No current facility-administered medications on file prior to visit.     Past Medical History  Diagnosis Date  . COPD (chronic obstructive pulmonary disease)   . CHF (congestive heart failure)     with diastolic dysfunction  . Atrial fibrillation   .  Hypertension   . Bladder cancer   . Respiratory failure     acute hypoxic RF secondary to COPD, exacerbation and bronchitis   . Acute bronchitis   . Hyponatremia     hosp 2011  . Leukocytosis     transient; hosp secondary to being on steriods 2011  . AAA (abdominal aortic aneurysm) 2010     Past Surgical History  Procedure Laterality Date  . Transurethral resection of bladder       Family History  Problem Relation Age of Onset  . Prostate cancer Father      History   Social History  . Marital Status: Married    Spouse Name: N/A    Number of Children: N/A  . Years of Education: N/A   Occupational History  . Not on file.   Social History Main Topics  . Smoking status: Former Smoker  . Smokeless tobacco: Never Used  . Alcohol Use: Yes     Comment: occasionally  . Drug Use: No  . Sexual Activity:    Other Topics Concern  . Not on file   Social History Narrative   Married, retired, gets regular exercise.      ROS A 10 point review of system was performed. It is negative other than that mentioned in the history of present illness.     PHYSICAL EXAM   BP 147/84  Pulse 92  Ht 5\' 7"  (1.702 m)  Wt 177 lb 4 oz (80.4 kg)  BMI 27.75 kg/m2 Constitutional: He is oriented to person, place, and time. He appears well-developed and well-nourished. No distress.  HENT: No nasal discharge.  Head: Normocephalic and atraumatic.  Eyes: Pupils are equal and round.  No discharge. Neck: Normal range of motion. Neck supple. No JVD present. No thyromegaly present.  Cardiovascular: Normal rate, regular rhythm, normal heart sounds. Exam reveals no gallop and no friction rub. No murmur heard.  Pulmonary/Chest: Effort normal and breath sounds normal. No stridor. No respiratory distress. He has no wheezes. He has no rales. He exhibits no tenderness.  Abdominal: Soft. Bowel sounds are normal. He exhibits no distension. There is no tenderness. There is no rebound and no guarding.    Musculoskeletal: Normal range of motion. He exhibits no edema and no tenderness.  Neurological: He is alert and oriented to person, place, and time. Coordination normal.  Skin: Skin is warm and dry. No rash noted. He is not diaphoretic. No erythema. No pallor.  Psychiatric: He has a normal mood and affect. His behavior is normal. Judgment and thought content normal.  Vascular: Radial pulses normal bilaterally. Femoral pulse, +2 bilaterally. Dorsalis pedis and posterior tibial is absent on the right side and faint on the left side. The right foot is colder than the left foot.     EKG: Atrial fibrillation with nonspecific ST changes.   ASSESSMENT AND PLAN

## 2013-07-31 NOTE — Assessment & Plan Note (Signed)
We should consider treatment with a statin due to his recent diagnosis of peripheral arterial disease.

## 2013-07-31 NOTE — Patient Instructions (Signed)
Your physician recommends that you have lab work today.   Your procedure will be at [facility] on Wednesday 08/09/13 at 10:30 am. Please be there at 08:30 am.   You will need to go to [facility] for a chest x ray.

## 2013-08-01 LAB — BASIC METABOLIC PANEL
BUN/Creatinine Ratio: 12 (ref 10–22)
BUN: 11 mg/dL (ref 8–27)
CALCIUM: 9.5 mg/dL (ref 8.6–10.2)
CO2: 27 mmol/L (ref 18–29)
Chloride: 98 mmol/L (ref 97–108)
Creatinine, Ser: 0.91 mg/dL (ref 0.76–1.27)
GFR calc Af Amer: 94 mL/min/{1.73_m2} (ref >59)
GFR calc non Af Amer: 82 mL/min/{1.73_m2} (ref >59)
GLUCOSE: 104 mg/dL — AB (ref 65–99)
POTASSIUM: 4.7 mmol/L (ref 3.5–5.2)
Sodium: 140 mmol/L (ref 134–144)

## 2013-08-01 LAB — PROTIME-INR
INR: 3.7 — AB (ref 0.8–1.2)
Prothrombin Time: 38.9 s — ABNORMAL HIGH (ref 9.1–12.0)

## 2013-08-07 ENCOUNTER — Telehealth: Payer: Self-pay

## 2013-08-07 NOTE — Telephone Encounter (Signed)
Pt called and has a question regarding his procedure on 08/09/2013. Please call.

## 2013-08-07 NOTE — Telephone Encounter (Signed)
Reviewed procedure info with patient.

## 2013-08-09 ENCOUNTER — Other Ambulatory Visit: Payer: Self-pay | Admitting: Cardiovascular Disease

## 2013-08-09 ENCOUNTER — Encounter (HOSPITAL_COMMUNITY)
Admission: RE | Disposition: A | Discharge status: Home / Self Care | Payer: Self-pay | Source: Ambulatory Visit | Attending: Cardiovascular Disease

## 2013-08-09 ENCOUNTER — Ambulatory Visit (HOSPITAL_COMMUNITY)
Admission: RE | Admit: 2013-08-09 | Discharge: 2013-08-09 | Disposition: A | Discharge status: Home / Self Care | Payer: Medicare Other | Source: Ambulatory Visit | Attending: Cardiovascular Disease | Admitting: Cardiovascular Disease

## 2013-08-09 DIAGNOSIS — I5032 Chronic diastolic (congestive) heart failure: Secondary | ICD-10-CM | POA: Insufficient documentation

## 2013-08-09 DIAGNOSIS — I70219 Atherosclerosis of native arteries of extremities with intermittent claudication, unspecified extremity: Secondary | ICD-10-CM | POA: Insufficient documentation

## 2013-08-09 DIAGNOSIS — I10 Essential (primary) hypertension: Secondary | ICD-10-CM | POA: Insufficient documentation

## 2013-08-09 DIAGNOSIS — Z87891 Personal history of nicotine dependence: Secondary | ICD-10-CM | POA: Insufficient documentation

## 2013-08-09 DIAGNOSIS — I509 Heart failure, unspecified: Secondary | ICD-10-CM | POA: Insufficient documentation

## 2013-08-09 DIAGNOSIS — I739 Peripheral vascular disease, unspecified: Secondary | ICD-10-CM

## 2013-08-09 DIAGNOSIS — I714 Abdominal aortic aneurysm, without rupture, unspecified: Secondary | ICD-10-CM | POA: Insufficient documentation

## 2013-08-09 DIAGNOSIS — J4489 Other specified chronic obstructive pulmonary disease: Secondary | ICD-10-CM | POA: Insufficient documentation

## 2013-08-09 DIAGNOSIS — I4891 Unspecified atrial fibrillation: Secondary | ICD-10-CM | POA: Insufficient documentation

## 2013-08-09 DIAGNOSIS — J449 Chronic obstructive pulmonary disease, unspecified: Secondary | ICD-10-CM | POA: Insufficient documentation

## 2013-08-09 DIAGNOSIS — Z7982 Long term (current) use of aspirin: Secondary | ICD-10-CM | POA: Insufficient documentation

## 2013-08-09 DIAGNOSIS — Z79899 Other long term (current) drug therapy: Secondary | ICD-10-CM | POA: Insufficient documentation

## 2013-08-09 DIAGNOSIS — Z7901 Long term (current) use of anticoagulants: Secondary | ICD-10-CM | POA: Insufficient documentation

## 2013-08-09 HISTORY — PX: LOWER EXTREMITY ANGIOGRAM: SHX5508

## 2013-08-09 HISTORY — PX: ABDOMINAL AORTAGRAM: SHX5454

## 2013-08-09 LAB — PROTIME-INR
INR: 1.08 (ref 0.00–1.49)
PROTHROMBIN TIME: 13.8 s (ref 11.6–15.2)

## 2013-08-09 LAB — POCT ACTIVATED CLOTTING TIME
ACTIVATED CLOTTING TIME: 254 s
Activated Clotting Time: 265 seconds

## 2013-08-09 SURGERY — ABDOMINAL AORTAGRAM
Anesthesia: LOCAL

## 2013-08-09 MED ORDER — SODIUM CHLORIDE 0.9 % IJ SOLN: 3.0000 mL | Freq: Two times a day (BID) | INTRAMUSCULAR | Status: DC

## 2013-08-09 MED ORDER — HEPARIN (PORCINE) IN NACL 2-0.9 UNIT/ML-% IJ SOLN
INTRAMUSCULAR | Status: AC
  Filled 2013-08-09: qty 1000

## 2013-08-09 MED ORDER — SODIUM CHLORIDE 0.9 % IV SOLN: INTRAVENOUS | Status: DC

## 2013-08-09 MED ORDER — MIDAZOLAM HCL 2 MG/2ML IJ SOLN
INTRAMUSCULAR | Status: AC
  Filled 2013-08-09: qty 2

## 2013-08-09 MED ORDER — HEPARIN SODIUM (PORCINE) 1000 UNIT/ML IJ SOLN
INTRAMUSCULAR | Status: AC
  Filled 2013-08-09: qty 1

## 2013-08-09 MED ORDER — ASPIRIN 81 MG PO CHEW: 81.0000 mg | CHEWABLE_TABLET | ORAL | Status: DC

## 2013-08-09 MED ORDER — VERAPAMIL HCL 2.5 MG/ML IV SOLN
INTRAVENOUS | Status: AC
  Filled 2013-08-09: qty 4

## 2013-08-09 MED ORDER — FENTANYL CITRATE 0.05 MG/ML IJ SOLN
INTRAMUSCULAR | Status: AC
  Filled 2013-08-09: qty 2

## 2013-08-09 MED ORDER — CLOPIDOGREL BISULFATE 300 MG PO TABS
ORAL_TABLET | ORAL | Status: AC
  Filled 2013-08-09: qty 2

## 2013-08-09 MED ORDER — SODIUM CHLORIDE 0.9 % IV SOLN
INTRAVENOUS | Status: DC
  Administered 2013-08-09: 09:00:00 via INTRAVENOUS

## 2013-08-09 MED ORDER — SODIUM CHLORIDE 0.9 % IV SOLN: 250.0000 mL | INTRAVENOUS | Status: DC | PRN

## 2013-08-09 MED ORDER — SODIUM CHLORIDE 0.9 % IJ SOLN: 3.0000 mL | INTRAMUSCULAR | Status: DC | PRN

## 2013-08-09 MED ORDER — LIDOCAINE HCL (PF) 1 % IJ SOLN
INTRAMUSCULAR | Status: AC
  Filled 2013-08-09: qty 30

## 2013-08-09 MED ORDER — CLOPIDOGREL BISULFATE 75 MG PO TABS: 75.0000 mg | ORAL_TABLET | Freq: Every day | ORAL | Status: DC

## 2013-08-09 NOTE — H&P (View-Only) (Signed)
Primary cardiologist: Dr. Rockey Situ  HPI  [name] is a 79 year old gentleman who was referred by Dr. Rockey Situ for evaluation and management of peripheral arterial disease.  He has a long smoking history (stopped 2012), hypertension, severe COPD, small abdominal aortic aneurysm and chronic atrial fibrillation. He is on long-term anticoagulation with warfarin. He has chronic dyspnea related to COPD and denies any chest pain. He started having right calf discomfort with walking a few months ago and this has been worsening since then. He is now having discomfort in the right calf at that. This has limited his ability to walk significantly. There is no lower extremity ulceration. There is mild discomfort affecting the left leg. He has no history of diabetes or coronary artery disease.   Allergies  Allergen Reactions  . Hydrocodone-Acetaminophen      Current Outpatient Prescriptions on File Prior to Visit  Medication Sig Dispense Refill  . aspirin 325 MG tablet Take 325 mg by mouth daily.        . digoxin (LANOXIN) 0.25 MG tablet Take 250 mcg by mouth daily. As needed.      . diltiazem (CARDIZEM CD) 300 MG 24 hr capsule Take 300 mg by mouth daily.        . Ipratropium-Albuterol (COMBIVENT) 20-100 MCG/ACT AERS respimat Inhale 2 puffs into the lungs 4 (four) times daily.       . Multiple Vitamins-Minerals (EYE VITAMINS) CAPS Take 1 capsule by mouth daily.        . Omega-3 Fatty Acids (FISH OIL) 1000 MG CAPS Take 1 capsule by mouth daily.       Marland Kitchen PROAIR HFA 108 (90 BASE) MCG/ACT inhaler Inhale 1 puff into the lungs every 6 (six) hours as needed.       . warfarin (COUMADIN) 5 MG tablet Take as directed by anticoagulation clinic  45 tablet  3   No current facility-administered medications on file prior to visit.     Past Medical History  Diagnosis Date  . COPD (chronic obstructive pulmonary disease)   . CHF (congestive heart failure)     with diastolic dysfunction  . Atrial fibrillation   .  Hypertension   . Bladder cancer   . Respiratory failure     acute hypoxic RF secondary to COPD, exacerbation and bronchitis   . Acute bronchitis   . Hyponatremia     hosp 2011  . Leukocytosis     transient; hosp secondary to being on steriods 2011  . AAA (abdominal aortic aneurysm) 2010     Past Surgical History  Procedure Laterality Date  . Transurethral resection of bladder       Family History  Problem Relation Age of Onset  . Prostate cancer Father      History   Social History  . Marital Status: Married    Spouse Name: N/A    Number of Children: N/A  . Years of Education: N/A   Occupational History  . Not on file.   Social History Main Topics  . Smoking status: Former Research scientist (life sciences)  . Smokeless tobacco: Never Used  . Alcohol Use: Yes     Comment: occasionally  . Drug Use: No  . Sexual Activity:    Other Topics Concern  . Not on file   Social History Narrative   Married, retired, gets regular exercise.      ROS A 10 point review of system was performed. It is negative other than that mentioned in the history of present illness.  PHYSICAL EXAM   BP 147/84  Pulse 92  Ht 5\' 7"  (1.702 m)  Wt 177 lb 4 oz (80.4 kg)  BMI 27.75 kg/m2 Constitutional: He is oriented to person, place, and time. He appears well-developed and well-nourished. No distress.  HENT: No nasal discharge.  Head: Normocephalic and atraumatic.  Eyes: Pupils are equal and round.  No discharge. Neck: Normal range of motion. Neck supple. No JVD present. No thyromegaly present.  Cardiovascular: Normal rate, regular rhythm, normal heart sounds. Exam reveals no gallop and no friction rub. No murmur heard.  Pulmonary/Chest: Effort normal and breath sounds normal. No stridor. No respiratory distress. He has no wheezes. He has no rales. He exhibits no tenderness.  Abdominal: Soft. Bowel sounds are normal. He exhibits no distension. There is no tenderness. There is no rebound and no guarding.    Musculoskeletal: Normal range of motion. He exhibits no edema and no tenderness.  Neurological: He is alert and oriented to person, place, and time. Coordination normal.  Skin: Skin is warm and dry. No rash noted. He is not diaphoretic. No erythema. No pallor.  Psychiatric: He has a normal mood and affect. His behavior is normal. Judgment and thought content normal.  Vascular: Radial pulses normal bilaterally. Femoral pulse, +2 bilaterally. Dorsalis pedis and posterior tibial is absent on the right side and faint on the left side. The right foot is colder than the left foot.     EKG: Atrial fibrillation with nonspecific ST changes.   ASSESSMENT AND PLAN

## 2013-08-09 NOTE — Discharge Instructions (Signed)
° ° ° °  Resume Warfarin tonight.  Start Plavix 75 mg once daily for 1 month (a prescription was sent to [facility]) Do not take Aspirin.     Angiography, Care After Refer to this sheet in the next few weeks. These instructions provide you with information on caring for yourself after your procedure. Your health care provider may also give you more specific instructions. Your treatment has been planned according to current medical practices, but problems sometimes occur. Call your health care provider if you have any problems or questions after your procedure.  WHAT TO EXPECT AFTER THE PROCEDURE After your procedure, it is typical to have the following sensations:  Minor discomfort or tenderness and a small bump at the catheter insertion site. The bump should usually decrease in size and tenderness within 1 to 2 weeks.  Any bruising will usually fade within 2 to 4 weeks. HOME CARE INSTRUCTIONS   You may need to keep taking blood thinners if they were prescribed for you. Only take over-the-counter or prescription medicines for pain, fever, or discomfort as directed by your health care provider.  Do not apply powder or lotion to the site.  Do not sit in a bathtub, swimming pool, or whirlpool for 5 to 7 days.  You may shower 24 hours after the procedure. Remove the bandage (dressing) and gently wash the site with plain soap and water. Gently pat the site dry.  Inspect the site at least twice daily.  Limit your activity for the first 48 hours. Do not bend, squat, or lift anything over 10 lb or as directed by your health care provider.  Do not drive home if you are discharged the day of the procedure. Have someone else drive you. Follow instructions about when you can drive or return to work. SEEK MEDICAL CARE IF:  You get lightheaded when standing up.  You have drainage (other than a small amount of blood on the dressing).  You have chills.  You have a fever.  You have redness, warmth,  swelling, or pain at the insertion site. SEEK IMMEDIATE MEDICAL CARE IF:   You develop chest pain or shortness of breath, feel faint, or pass out.  You have bleeding, swelling larger than a walnut, or drainage from the catheter insertion site.  You develop pain, discoloration, coldness, or severe bruising in the leg or arm that held the catheter.  You develop bleeding from any other place, such as the bowels. You may see bright red blood in your urine or stools, or your stools may appear black and tarry.  You have heavy bleeding from the site. If this happens, hold pressure on the site. MAKE SURE YOU:  Understand these instructions.  Will watch your condition.  Will get help right away if you are not doing well or get worse. Document Released: 11/27/2004 Document Revised: 01/11/2013 Document Reviewed: 10/03/2012 [facility] Patient Information 2014 [place].

## 2013-08-09 NOTE — CV Procedure (Signed)
PERIPHERAL VASCULAR PROCEDURE  NAME:  Michael Kline   MRN: [id number] DOB:  May 25, 1935   ADMIT DATE: 08/09/2013  Performing Cardiologist: Kathlyn Sacramento Primary Physician: Deberah Pelton, MD Primary Cardiologist:  Dr. Rockey Situ  Procedures Performed:  Abdominal Aortic Angiogram with Bi-Iliofemoral Runoff  Selective right lower extremity arterial angiography  Right popliteal artery orbital atherectomy  Right popliteal artery drug coated balloon angioplasty X 2 in the proximal and midsegment  Mynx closure device    Indication(s):   Claudication    Consent: The procedure with Risks/Benefits/Alternatives and Indications was reviewed with the patient .  All questions were answered.  Medications:  Sedation:  2 mg IV Versed, 50 mcg IV Fentanyl  Contrast:  168 mL  Visipaque   Procedural details: The left groin was prepped, draped, and anesthetized with 1% lidocaine. Using modified Seldinger technique, a 5 French sheath was introduced into the left common femoral artery. A 5 Fr Short Pigtail Catheter was advanced of over a  Versicore wire into the descending Aorta to a level just above the renal arteries. A power injection of 39ml/sec contrast over 1 sec was performed for Abdominal Aortic Angiography.  The catheter was then pulled back to a level just above the Aortic bifurcation, and a second power injection was performed to evaluate the iliac arteries.   Bilateral lower extremity arterial run off was then performed via power injection of 7 ml / sec contrast for a total of 77 ml.   Interventional Procedure:  The pigtail catheter was changed over the Versicore wire for A crossover catheter which was then pulled back the aortic bifurcation and the wire was advanced down the contralateral common iliac artery.  A glide wire was then advanced to the contralateral common femoral artery, the catheter and sheath where exchanged into a 6 French 45 cm destination sheath. The patient was  given a total of 8000 units of unfractionated heparin with an ACT maintained above 250. I then used run through wire over an 014 CXI catheter to cross the stenosis in the popliteal artery without much difficulty. The wire was then removed and exchanged for a Viper wire. Orbital atherectomy was done with a diamondback device to both lesions in the proximal as well as mid behind the knee popliteal artery at low, medium and high speed. Angiography showed improved appearance with significant residual stenosis. I then used a 5 x 60 mm Lutonix drug-coated balloon in the behind the knee popliteal artery and inflated this to 10 atmospheres for 2 minutes. A 6 x 40 mm Lutonix drug-coated balloon was used on the proximal popliteal lesion. There was 10% residual stenosis in both. 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 The same balloon was used to perform low-pressure inflation at 4 atmospheres for 2 minutes. The dissection persisted but was nonflow limiting. This did not require stenting. There was no evidence of distal embolization. The wire was then removed. The sheath was removed over a wire and the dilator. This was exchanged into a 6 [place] sheath. Hemostasis was achieved with a  Mynx closure device. The patient tolerated the procedure well with no immediate complications.   Hemodynamics:  Central Aortic Pressure / Mean Aortic Pressure: 141/68  Findings:  Abdominal aorta: Normal in size with no evidence of aneurysm.  Left renal artery: Normal  Right renal artery: Normal  Celiac artery: Not well visualized  Superior mesenteric artery: Not well visualized.  Right common iliac artery: Mild 20% ostial stenosis with minor irregularities throughout.  Right  internal iliac artery: Minor irregularities.  Right external iliac artery: Minor irregularities.  Right common femoral artery: Minor irregularities.  Right profunda femoral artery: Normal  Right superficial femoral artery: Multiple  areas of 20-30% stenosis in the proximal and midsegment.  Right popliteal artery: There is a 90% stenosis at the proximal popliteal artery. There is diffuse 99% stenosis in the midsegment behind the knee.  Three-vessel runoff below the knee.   Left common iliac artery:  Minor irregularities.  Left internal iliac artery: Mild proximal disease.  Left external iliac artery: 20% mid stenosis.  Left common femoral artery: Minor irregularities.  Left profunda femoral artery: Normal  Left superficial femoral artery:  Diffuse 20-30% disease throughout its course.  Left popliteal artery: Mild 10% diffuse disease.  Three-vessel runoff below the knee.   Conclusions: 1. Severe ight popliteal artery stenosis in the proximal as well as mid segment. 2. Successful orbital atherectomy and drug-coated balloon angioplasty to the right popliteal artery. A nonflow limiting dissection was noted proximally which was left to be treated medically.  Recommendations:  Resume warfarin today. I will treat with Plavix for one month without aspirin. Plavix can be discontinued after 30 days. Continue aggressive treatment of risk factors.   Kathlyn Sacramento, MD, [facility] 08/09/2013 12:43 PM

## 2013-08-09 NOTE — Interval H&P Note (Signed)
History and Physical Interval Note:  08/09/2013 10:48 AM  Michael Kline  has presented today for surgery, with the diagnosis of PAD  The various methods of treatment have been discussed with the patient and family. After consideration of risks, benefits and other options for treatment, the patient has consented to  Procedure(s): ABDOMINAL AORTAGRAM (N/A) as a surgical intervention .  The patient's history has been reviewed, patient examined, no change in status, stable for surgery.  I have reviewed the patient's chart and labs.  Questions were answered to the patient's satisfaction.     Kathlyn Sacramento

## 2013-08-15 ENCOUNTER — Telehealth: Payer: Self-pay

## 2013-08-15 NOTE — Telephone Encounter (Signed)
Pt called and states he has some questions regarding his recent procedure. Please call.

## 2013-08-15 NOTE — Telephone Encounter (Signed)
Patient had a cath 3/18 He called today and stated that when he got home the cath location was the size of a golf ball and tender He says it is feeling a little better today and about the size of a ping pong ball He just wanted Dr. Fletcher Anon to know

## 2013-08-16 NOTE — Telephone Encounter (Signed)
Patient states it is still tender but the swelling is down about half way from yesterday. He has a follow up with Dr. Fletcher Anon 09/01/13.

## 2013-08-16 NOTE — Telephone Encounter (Signed)
If still tender today then schedule him for Left groin arterial duplex (psuedoanurysm study) to be done by Traci.

## 2013-08-16 NOTE — Telephone Encounter (Signed)
[place] an eye on it and call us if it gets worse.

## 2013-08-17 NOTE — Telephone Encounter (Signed)
Spoke with patient this morning  He stated it is still getting better I instructed him to call if it did not continue to get better or became tender again  Patient verbalized understanding

## 2013-09-01 ENCOUNTER — Encounter: Payer: Self-pay | Admitting: Cardiovascular Disease

## 2013-09-01 ENCOUNTER — Ambulatory Visit (INDEPENDENT_AMBULATORY_CARE_PROVIDER_SITE_OTHER): Payer: Medicare Other | Admitting: Cardiovascular Disease

## 2013-09-01 VITALS — BP 140/62 | HR 103 | Ht 67.0 in | Wt 177.0 lb

## 2013-09-01 DIAGNOSIS — I4891 Unspecified atrial fibrillation: Secondary | ICD-10-CM

## 2013-09-01 DIAGNOSIS — I739 Peripheral vascular disease, unspecified: Secondary | ICD-10-CM

## 2013-09-01 MED ORDER — CLOPIDOGREL BISULFATE 75 MG PO TABS: ORAL_TABLET | ORAL | Status: DC

## 2013-09-01 MED ORDER — WARFARIN SODIUM 6 MG PO TABS: 6.0000 mg | ORAL_TABLET | Freq: Every day | ORAL | Status: AC

## 2013-09-01 NOTE — Patient Instructions (Signed)
Your physician has requested that you have an ankle brachial index (ABI). During this test an ultrasound and blood pressure cuff are used to evaluate the arteries that supply the arms and legs with blood. Allow thirty minutes for this exam. There are no restrictions or special instructions.  Your physician has recommended you make the following change in your medication:  Restart Coumadin today at 6 mg daily  Stop Plavix in 1 week   Follow up with your Coumadin Clinic in 5 days   Your physician wants you to follow-up in: 6 months. You will receive a reminder letter in the mail two months in advance. If you don't receive a letter, please call our office to schedule the follow-up appointment.

## 2013-09-01 NOTE — Progress Notes (Signed)
Primary cardiologist: Dr. Rockey Situ  HPI  Michael Kline is a 79 year old gentleman who is here today for followup visit regarding  peripheral arterial disease.  He has a long smoking history (stopped 2012), hypertension, severe COPD, small abdominal aortic aneurysm and chronic atrial fibrillation. He is on long-term anticoagulation with warfarin.  He was seen recently for progressive  right calf claudication. ABI was mildly reduced with evidence of significant disease in the distal SFA or proximal popliteal artery which was not well-visualized by duplex. I proceeded with angiography which showed no significant aortoiliac disease. There was 90% stenosis in the right proximal popliteal artery and 99% stenosis in the midsegment. I performed successful orbital atherectomy and a coated balloon angioplasty with excellent results. He reports resolution of claudication. He has been able to walk more than a mile without symptoms. He did have swelling and discomfort at the left cath site but this has gradually improved.  Allergies  Allergen Reactions  . Hydrocodone-Acetaminophen Rash     Current Outpatient Prescriptions on File Prior to Visit  Medication Sig Dispense Refill  . clopidogrel (PLAVIX) 75 MG tablet Take 1 tablet (75 mg total) by mouth daily.  30 tablet  0  . digoxin (LANOXIN) 0.25 MG tablet Take 250 mcg by mouth daily.       Marland Kitchen diltiazem (CARDIZEM CD) 300 MG 24 hr capsule Take 300 mg by mouth daily.       . Ipratropium-Albuterol (COMBIVENT) 20-100 MCG/ACT AERS respimat Inhale 2 puffs into the lungs 4 (four) times daily.       . Multiple Vitamins-Minerals (EYE VITAMINS) CAPS Take 1 capsule by mouth daily.        . NON FORMULARY Oxygen 1 liter daily.      . Omega-3 Fatty Acids (FISH OIL) 1000 MG CAPS Take 1 capsule by mouth daily.       Marland Kitchen PROAIR HFA 108 (90 BASE) MCG/ACT inhaler Inhale 1-2 puffs into the lungs every 6 (six) hours as needed for wheezing.        No current facility-administered  medications on file prior to visit.     Past Medical History  Diagnosis Date  . COPD (chronic obstructive pulmonary disease)   . CHF (congestive heart failure)     with diastolic dysfunction  . Atrial fibrillation   . Hypertension   . Bladder cancer   . Respiratory failure     acute hypoxic RF secondary to COPD, exacerbation and bronchitis   . Acute bronchitis   . Hyponatremia     hosp 2011  . Leukocytosis     transient; hosp secondary to being on steriods 2011  . AAA (abdominal aortic aneurysm) 2010     Past Surgical History  Procedure Laterality Date  . Transurethral resection of bladder       Family History  Problem Relation Age of Onset  . Prostate cancer Father      History   Social History  . Marital Status: Married    Spouse Name: N/A    Number of Children: N/A  . Years of Education: N/A   Occupational History  . Not on file.   Social History Main Topics  . Smoking status: Former Research scientist (life sciences)  . Smokeless tobacco: Never Used  . Alcohol Use: Yes     Comment: occasionally  . Drug Use: No  . Sexual Activity:    Other Topics Concern  . Not on file   Social History Narrative   Married, retired, gets regular exercise.  ROS A 10 point review of system was performed. It is negative other than that mentioned in the history of present illness.   PHYSICAL EXAM   BP 140/62  Pulse 103  Ht 5\' 7"  (1.702 m)  Wt 177 lb (80.287 kg)  BMI 27.72 kg/m2 Constitutional: He is oriented to person, place, and time. He appears well-developed and well-nourished. No distress.  HENT: No nasal discharge.  Head: Normocephalic and atraumatic.  Eyes: Pupils are equal and round.  No discharge. Neck: Normal range of motion. Neck supple. No JVD present. No thyromegaly present.  Cardiovascular: Normal rate, regular rhythm, normal heart sounds. Exam reveals no gallop and no friction rub. No murmur heard.  Pulmonary/Chest: Effort normal and breath sounds normal. No  stridor. No respiratory distress. He has no wheezes. He has no rales. He exhibits no tenderness.  Abdominal: Soft. Bowel sounds are normal. He exhibits no distension. There is no tenderness. There is no rebound and no guarding.  Musculoskeletal: Normal range of motion. He exhibits no edema and no tenderness.  Neurological: He is alert and oriented to person, place, and time. Coordination normal.  Skin: Skin is warm and dry. No rash noted. He is not diaphoretic. No erythema. No pallor.  Psychiatric: He has a normal mood and affect. His behavior is normal. Judgment and thought content normal.  Vascular: Radial pulses normal bilaterally. Femoral pulse, +2 bilaterally. There is no evidence of hematoma or bruit in the left groin. Distal pulses are palpable bilaterally.     EKG: Atrial fibrillation with nonspecific ST changes.   ASSESSMENT AND PLAN

## 2013-09-01 NOTE — Assessment & Plan Note (Signed)
Continue rate control. I instructed him to resume  warfarin after the procedure. However, he did not do that. I asked him to resume warfarin to decrease risk of stroke. He is to followup with his primary physician about INR management.

## 2013-09-01 NOTE — Assessment & Plan Note (Signed)
He is doing very well after recent atherectomy and drug-coated balloon angioplasty on the right popliteal artery. Claudication resolved. I will obtain an ABI. He is to continue Plavix for another week and then it can be stopped given that he is on anticoagulation.

## 2013-10-06 ENCOUNTER — Encounter (INDEPENDENT_AMBULATORY_CARE_PROVIDER_SITE_OTHER): Payer: Medicare Other

## 2013-10-06 DIAGNOSIS — I739 Peripheral vascular disease, unspecified: Secondary | ICD-10-CM

## 2013-10-13 ENCOUNTER — Telehealth: Payer: Self-pay | Admitting: *Deleted

## 2013-10-13 DIAGNOSIS — I739 Peripheral vascular disease, unspecified: Secondary | ICD-10-CM

## 2013-10-13 NOTE — Telephone Encounter (Signed)
Message copied by Tracie Harrier on Fri Oct 13, 2013  9:32 AM ------      Message from: Kathlyn Sacramento A      Created: Wed Oct 11, 2013  3:51 PM       Normal ABI after angioplasty on right leg. Recommend LE arterial duplex in 3 months. ------

## 2013-12-21 ENCOUNTER — Other Ambulatory Visit (HOSPITAL_COMMUNITY): Payer: Self-pay | Admitting: *Deleted

## 2013-12-21 DIAGNOSIS — I739 Peripheral vascular disease, unspecified: Secondary | ICD-10-CM

## 2014-01-18 ENCOUNTER — Encounter (INDEPENDENT_AMBULATORY_CARE_PROVIDER_SITE_OTHER): Payer: Medicare Other

## 2014-01-18 DIAGNOSIS — I739 Peripheral vascular disease, unspecified: Secondary | ICD-10-CM

## 2014-01-23 ENCOUNTER — Telehealth: Payer: Self-pay | Admitting: *Deleted

## 2014-01-23 DIAGNOSIS — I739 Peripheral vascular disease, unspecified: Secondary | ICD-10-CM

## 2014-01-23 NOTE — Telephone Encounter (Signed)
Message copied by Tracie Harrier on Tue Jan 23, 2014 10:30 AM ------      Message from: Kathlyn Sacramento A      Created: Tue Jan 23, 2014  9:11 AM       Normal ABI and patent popliteal artery. Recommend a follow up arterial duplex in 6 months. ------

## 2014-03-01 ENCOUNTER — Ambulatory Visit (INDEPENDENT_AMBULATORY_CARE_PROVIDER_SITE_OTHER): Payer: Medicare Other | Admitting: Cardiovascular Disease

## 2014-03-01 ENCOUNTER — Encounter: Payer: Self-pay | Admitting: Cardiovascular Disease

## 2014-03-01 VITALS — BP 110/70 | HR 117 | Ht 67.0 in | Wt 171.8 lb

## 2014-03-01 DIAGNOSIS — R079 Chest pain, unspecified: Secondary | ICD-10-CM

## 2014-03-01 DIAGNOSIS — I482 Chronic atrial fibrillation, unspecified: Secondary | ICD-10-CM

## 2014-03-01 DIAGNOSIS — I739 Peripheral vascular disease, unspecified: Secondary | ICD-10-CM

## 2014-03-01 NOTE — Patient Instructions (Signed)
Continue same medications.   Your physician wants you to follow-up in: 1 year.  You will receive a reminder letter in the mail two months in advance. If you don't receive a letter, please call our office to schedule the follow-up appointment.  

## 2014-03-01 NOTE — Assessment & Plan Note (Signed)
He is doing well overall with no recurrent claudication. Lower extremity arterial duplex in late August showed patent right popliteal artery with normal ABI. Recommend a followup study in 6 months. He can followup with me on a yearly basis.

## 2014-03-01 NOTE — Progress Notes (Signed)
Primary cardiologist: Dr. Rockey Situ  HPI  [name] is a 79 year old gentleman who is here today for followup visit regarding  peripheral arterial disease.  He has a long smoking history (stopped 2012), hypertension, severe COPD, small abdominal aortic aneurysm and chronic atrial fibrillation. He is on long-term anticoagulation with warfarin.  He was seen in 07/2013 for progressive  right calf claudication. ABI was mildly reduced with evidence of significant disease in the distal SFA or proximal popliteal artery which was not well-visualized by duplex. I proceeded with angiography which showed no significant aortoiliac disease. There was 90% stenosis in the right proximal popliteal artery and 99% stenosis in the midsegment. I performed successful orbital atherectomy and a coated balloon angioplasty with excellent results. He reports resolution of claudication. He has been able to walk more than a mile without symptoms. He has been doing well and denies claudication. He has mild right leg edema. He is tachycardic today it did not take diltiazem or digoxin yet.  Allergies  Allergen Reactions  . Hydrocodone-Acetaminophen Rash     Current Outpatient Prescriptions on File Prior to Visit  Medication Sig Dispense Refill  . albuterol (PROAIR HFA) 108 (90 BASE) MCG/ACT inhaler INHALE 2 PUFFS EVERY SIX (6) HOURS AS NEEDED FOR WHEEZING.      . clopidogrel (PLAVIX) 75 MG tablet Take 1 tablet daily. Stop one week from 09/01/13  30 tablet  0  . digoxin (LANOXIN) 0.25 MG tablet Take 250 mcg by mouth daily.       Marland Kitchen diltiazem (CARDIZEM CD) 300 MG 24 hr capsule Take 300 mg by mouth daily.       . Ipratropium-Albuterol (COMBIVENT) 20-100 MCG/ACT AERS respimat Inhale 2 puffs into the lungs 4 (four) times daily.       . Multiple Vitamins-Minerals (EYE VITAMINS) CAPS Take 1 capsule by mouth daily.        . NON FORMULARY Oxygen 1 liter daily.      . Omega-3 Fatty Acids (FISH OIL) 1000 MG CAPS Take 1 capsule by  mouth daily.       Marland Kitchen PROAIR HFA 108 (90 BASE) MCG/ACT inhaler Inhale 1-2 puffs into the lungs every 6 (six) hours as needed for wheezing.       . warfarin (COUMADIN) 6 MG tablet Take 1 tablet (6 mg total) by mouth daily.  30 tablet  6   No current facility-administered medications on file prior to visit.     Past Medical History  Diagnosis Date  . COPD (chronic obstructive pulmonary disease)   . CHF (congestive heart failure)     with diastolic dysfunction  . Atrial fibrillation   . Hypertension   . Bladder cancer   . Respiratory failure     acute hypoxic RF secondary to COPD, exacerbation and bronchitis   . Acute bronchitis   . Hyponatremia     hosp 2011  . Leukocytosis     transient; hosp secondary to being on steriods 2011  . AAA (abdominal aortic aneurysm) 2010     Past Surgical History  Procedure Laterality Date  . Transurethral resection of bladder       Family History  Problem Relation Age of Onset  . Prostate cancer Father      History   Social History  . Marital Status: Married    Spouse Name: N/A    Number of Children: N/A  . Years of Education: N/A   Occupational History  . Not on file.   Social History Main Topics  .  Smoking status: Former Research scientist (life sciences)  . Smokeless tobacco: Never Used  . Alcohol Use: Yes     Comment: occasionally  . Drug Use: No  . Sexual Activity:    Other Topics Concern  . Not on file   Social History Narrative   Married, retired, gets regular exercise.      ROS A 10 point review of system was performed. It is negative other than that mentioned in the history of present illness.   PHYSICAL EXAM   There were no vitals taken for this visit. Constitutional: He is oriented to person, place, and time. He appears well-developed and well-nourished. No distress.  HENT: No nasal discharge.  Head: Normocephalic and atraumatic.  Eyes: Pupils are equal and round.  No discharge. Neck: Normal range of motion. Neck supple. No JVD  present. No thyromegaly present.  Cardiovascular: Normal rate, regular rhythm, normal heart sounds. Exam reveals no gallop and no friction rub. No murmur heard.  Pulmonary/Chest: Effort normal and breath sounds normal. No stridor. No respiratory distress. He has no wheezes. He has no rales. He exhibits no tenderness.  Abdominal: Soft. Bowel sounds are normal. He exhibits no distension. There is no tenderness. There is no rebound and no guarding.  Musculoskeletal: Normal range of motion. He exhibits mild right leg edema and no tenderness.  Neurological: He is alert and oriented to person, place, and time. Coordination normal.  Skin: Skin is warm and dry. No rash noted. He is not diaphoretic. No erythema. No pallor.  Psychiatric: He has a normal mood and affect. His behavior is normal. Judgment and thought content normal.  Vascular: Radial pulses normal bilaterally. Femoral pulse, +2 bilaterally. Distal pulses are palpable bilaterally.     EKG: Atrial fibrillation  - occasional ectopic ventricular beat    ABNORMAL RHYTHM   ASSESSMENT AND PLAN

## 2014-03-01 NOTE — Assessment & Plan Note (Signed)
Ventricular rate is high today. However, he did not take digoxin or diltiazem today. Continue long-term anticoagulation with warfarin.

## 2014-05-03 ENCOUNTER — Encounter (HOSPITAL_COMMUNITY): Payer: Self-pay | Admitting: Cardiovascular Disease

## 2014-05-25 DEATH — deceased

## 2014-09-14 NOTE — Discharge Summary (Signed)
PATIENT NAME:  Michael Kline, Michael Kline MR#:  [id number] DATE OF BIRTH:  Dec 30, 1934  DATE OF ADMISSION:  05/23/2012 DATE OF DISCHARGE:  05/26/2012  PRESENTING COMPLAINT: Shortness of breath and cough.   PRIMARY CARE PHYSICIANS: Dr. Campbell Stall at [facility] in [place] and Dr. Kerri Perches at [facility]: 1. Acute on chronic respiratory failure, hypoxic respiratory failure due to chronic obstructive pulmonary disease exacerbation.  2. Hypoxia, now requiring home oxygen.  3. Pneumonia, bilateral patchy.  4. Chronic atrial fibrillation, on Coumadin.   CONDITION ON DISCHARGE: Fair, sats 84% on room air with exertion, 90 to 93% on 3.5 liters nasal cannula.   MEDICATIONS: 1. Hydrochlorothiazide 25 mg daily.  2. Diltiazem 300 mg extended-release p.o. daily.  3. Aspirin 325 mg p.o. daily.  4. Digoxin 0.125 p.o. daily as needed.  5. Bystolic 1 tablet daily as needed.  6. Lasix 40 mg daily as needed.  7. Fish oil 1 tablet daily.  8. Warfarin 5 mg 1 tablet until INR checked next week.  9. Combivent 2 puffs every 4 hours.  10. Prednisone taper 60 mg, taper x 10 mg daily until complete.  11. Levaquin 500 mg p.o. daily, complete the course.   HOME OXYGEN: 4 liters nasal cannula continuous.     FOLLOWUP:  1. Follow up with Dr. Valetta Close on Monday, January 6th, to get PT-INR checked at [facility].  2. Follow up with your primary care physician, appointment has been made for January 14th at 11:15 a.m.   DISCHARGE LABORATORY, DIAGNOSTIC AND RADIOLOGICAL DATA:  PT-INR is 29.4 and 2.8. White count is 17.0, hemoglobin and hematocrit is 10.5 and 32.2, platelet count is 385. Glucose is 173, BUN 18, creatinine 0.96, sodium is 133, potassium 4, chloride is 98, bicarbonate 26. Hemoglobin A1c 6.0. Influenza A plus B negative. Streptococcal pneumoniae antigen in the urine negative. Legionella negative. Blood cultures negative in 48 hours. Troponin 0.02.   EKG showed  atrial fibrillation with RVR.   BRIEF SUMMARY OF HOSPITAL COURSE: Michael Kline is a very pleasant 80 year old Caucasian gentleman with history of chronic obstructive pulmonary disease, comes in with:   1. Systemic inflammatory response syndrome secondary to bilateral patchy pneumonia: The patient was admitted on the telemetry floor, started on IV Rocephin and Zithromax , which was changed to p.o. Levaquin. The patient's white count on admission was around 8000. His white count went up to 17,000, which was likely due to the effects of steroids. The patient remained afebrile. His symptoms improved clinically. He was changed to p.o. Levaquin to finish up a 7 to 8-day course of treatment for pneumonia.  2. Acute on chronic hypoxic respiratory failure secondary to emphysema/chronic obstructive pulmonary disease exacerbation: The patient was given high dose of steroids. His steroids have been tapered. His sats were 84% on exertion on room air, went up to 90 to 93% on 3.5 liters. He is prescribed home oxygen, about 4 liters nasal cannula continuous. He has used it in the past. We will have primary care physician wean him off oxygen as outpatient.  3. Hemoptysis x 1, resolved: Likely in the setting of pneumonia. His Coumadin was held, however, prior to discharge he was placed on 5 mg.  INR was around 2.8. The patient will follow up with Dr. Valetta Close, [facility], on Monday, January 6th, to get his PT-INR checked and  adjust dose accordingly. He will also follow up for his routine hospital followup on January 14th as above.  4. Hypertension: Continued on Cardizem and hydrochlorothiazide.   The hospital stay otherwise remained stable.    CODE STATUS: The patient remained a FULL CODE.   TIME SPENT: 40 minutes. ____________________________ Hart [place] Posey Pronto, MD sap:cb D: 05/27/2012 07:00:04 ET T: 05/27/2012 09:18:18 ET JOB#: [id number]  cc: Michael Zagal A. Posey Pronto, MD, <Dictator> [facility], Dr.  Campbell Stall [facility], Dr. Kerri Perches Ilda Basset MD ELECTRONICALLY SIGNED 05/29/2012 11:36

## 2014-09-14 NOTE — H&P (Signed)
PATIENT NAME:  Michael Kline, Michael Kline MR#:  [id number] DATE OF BIRTH:  1935/03/07  DATE OF ADMISSION:  05/23/2012  CHIEF COMPLAINT:  Shortness of breath.   HISTORY OF PRESENT ILLNESS: A 80 year old male with known history of COPD, atrial fibrillation, who presents with progressive shortness of breath over the last two weeks with acute worsening over the last four days and ultimately significant worsening this morning. The patient notes that a couple of weeks ago he developed some upper respiratory symptoms, including some mild rhinorrhea. He might have been around some people that have some colds. Subsequently, he started having some difficulty breathing associated with mild productive cough. About a week ago he started developing chills and then the shortness of breath and cough worsened to the point that he started having some mild streaks of blood within clear sputum. He notes that he does not cough at baseline. He has not seen a physician for these symptoms. He did have some pleuritic chest pain, but that has resolved today. Due to the progression of his symptoms, he presented to the Emergency Department today because of acute worsening of shortness of breath and he stated, "I just could not catch my breath" with moderate severity and exacerbation with exertion or coughing.  He has been started on Zithromax, supplemental oxygen, has received antibiotics, Solu-Medrol, nebulizer treatment, with improvement of his respiratory status.   In the Emergency Department he was noted to be in mild respiratory distress, initially wheezing with prolonged expirations and accessory muscle use. He was hypoxic at 86% on room air and noted to have bilateral opacities on chest x-ray so he is being admitted for concern of COPD exacerbation as a result of community-acquired pneumonia.  To improve his symptoms he has been using his inhalers with mild relief.  Regarding his COPD he notes that he has been compliant with medications.  He quit tobacco about one year ago. He had been on home oxygen therapy in the past, however, he discontinued it about eight months ago and had been doing well. He notes that he his primary care is between the [place], as well as [facility]. He denies any acute medical issues.   PAST MEDICAL AND SURGICAL HISTORY:  1.  History of COPD.  2.  Prior history of pneumonia.  3.  History of hypertension.  4.  History of atrial fibrillation. 5.  In the records there is history of diastolic congestive heart failure with preserved ejection fraction at 50 to 55% in 2011.  6.  History of bladder cancer, status post TURP and tumor resection.  7.  Bilateral cataract surgery.  8.  Left elbow surgery to relieve pressure on the nerve.  9.  Vasectomy.   ALLERGIES: CODEINE. He notes VICODIN, the codeine aspect of the Vicodin causes him to have a rash.   MEDICATIONS: 1.  Aspirin 325 mg daily.  2.  Bystolic 1 tablet daily, as needed.  3.  Combivent 2 puffs every six hours, as needed.   4.  Combivent 2 puffs inhaled every four hours, as needed for wheezing, shortness of breath.  5.  Digoxin 0.125 mg daily, as needed.  6.  Diltiazem 300 mg extended release tablet daily.  7.  Fish oil 1 tablet daily.  8.  Hydrochlorothiazide 25 mg daily.  9.  Lasix 40 mg daily, as needed.  10.  Warfarin 5 mg daily on Tuesday, Thursday, Saturday, and Sunday.  11.  Warfarin 7.5 mg on Monday, Wednesday and Friday.  FAMILY HISTORY: Notable for mother had Alzheimer's dementia.  Father passed away from prostate cancer. He denies history of coronary artery disease or any pulmonary problems.   SOCIAL HISTORY: The patient has smoked anywhere between 1 to 2 packs per day for the last 40 years. He quit about one year ago. He denies illicit drug use. He admits to drinking 1 to 2 drinks monthly. He has been married for the last 44 years and he lives at home.   REVIEW OF SYSTEMS: CONSTITUTIONAL: Denies fevers, but admits to  chills. Denies weight loss or weight gain.  EYES: Denies blurred vision, double vision, eye pain.  ENT: Denies tinnitus, ear pain, epistaxis. Does note that he has had history of sinus infections, none recently.  RESPIRATORY: Admits to cough, wheezing, shortness of breath, intermittent painful respirations.  Has history of COPD.   CARDIOVASCULAR: Denies chest pain, orthopnea.  Notes that he has occasional ankle edema. Denies palpitations.  Has history of atrial fibrillation.  GASTROINTESTINAL: Denies nausea, vomiting, diarrhea, abdominal pain, melena, or rectal bleeding.  GENITOURINARY: Denies frequency, urgency or dysuria.  ENDOCRINE: Denies night sweats.  INTEGUMENTARY: Denies any new skin rashes or any lesions.  MUSCULOSKELETAL: Denies arthralgias, myalgias or joint swelling.  NEUROLOGICAL: Denies tremors, CVA, headache or dysarthria.  PSYCH: Denies anxiety or depression.   PHYSICAL EXAMINATION:  VITAL SIGNS: Temperature 99.2, heart rate 108 to 130, respirations 24, blood pressure 119/76, oxygen saturation 86% on room air. He is currently on 5 liters nasal cannula with improvement of his oxygenation to 93 to 94%.  GENERAL: Caucasian male in no current respiratory distress. He is nontoxic appearing.  EYES: Pupils are equal, round, and reactive to light and accommodation. Anicteric sclerae. Normal lids.  ENT: Normal external ears and nose exam.  Posterior pharynx is clear without erythema or exudate.  CARDIOVASCULAR: S1, S2 is tachycardic.  No pretibial edema is appreciated.  PULMONARY:  Positive expiratory wheezes, diffusely.  No rales currently, although this was noted on ED evaluation. No accessory muscle use.  ABDOMEN: Soft, nontender, nondistended. No hepatosplenomegaly appreciated.  SKIN: Warm and dry. There are some areas of seborrheic keratosis on the back, very mild. No skin lesions appreciated.  MUSCULOSKELETAL: No clubbing or cyanosis appreciated in the nails.  Full range of motion  in bilateral upper and lower extremities. Motor strength is intact.  PSYCH: The patient is awake, alert, oriented x 3, able to give me a full history. Judgment and insight are intact.   LABORATORY DATA: Troponin less than 0.02. CK 53, CPK-MB 1.3. White count is 10.3, hemoglobin 11.5, hematocrit 34.1, platelets 319, with an MCV of 91.   Glucose is 115, BUN 18, creatinine 1.08, sodium 133, potassium 4.3, chloride 101, bicarbonate 27, calcium 8.3, anion gap 4, with an osmolality of 269, which is reduced. INR is 2.8.   EKG shows atrial fibrillation with rapid ventricular response at 104 beats per minute.   Chest x-ray shows new diffuse interstitial pulmonary opacities, bilaterally. There are also trace bilateral pleural effusions.   ASSESSMENT AND PLAN: A 80 year old male with history of chronic obstructive pulmonary disease, atrial fibrillation, presenting with progressive cough, chills, dyspnea.  Derold was found to be significantly tachycardic, tachypneic, with bilateral infiltrates on x-ray, as well as hypoxic, consistent with acute respiratory failure, sepsis due to community-acquired pneumonia, as well as atrial fibrillation with rapid ventricular response.  1.  Severe sepsis due to pneumonia present on admission as evidenced by tachypnea and tachycardia. This is due to community-acquired pneumonia. The  patient will be managed with IV antibiotics. Blood cultures have been sent and these will be followed up.  Further management as outlined below.  2.  Community-acquired pneumonia.  The patient has received ceftriaxone and azithromycin in the Emergency Department. I agree with this management and this will be continued. We have sent blood cultures and will send sputum cultures, when available. Check urine legionella, urine streptococcal antigens, and influenza serologies Will continue Solu-Medrol 60 mg IV q.8 hours with Accu-Cheks. We will continue nebulizer treatments and supplemental oxygen, as the  patient is significantly hypoxic due to this pneumonia. He is going to be placed on telemetry and will be monitored due to his concurrent atrial fibrillation with rapid ventricular response.  3.  Chronic obstructive pulmonary disease exacerbation, due to underlying pneumonia.  Nebulizer treatments every four hours, with albuterol every two hours, as needed.  Continue steroids. Continue antibiotics.  4.  Acute respiratory failure due to pneumonia and chronic obstructive pulmonary disease exacerbation. Continue supplemental oxygen. The patient was on room air prior to admission and is requiring 5 liters nasal cannula to maintain saturation of 93%. He did present hypoxic at 86%.  5.  Atrial fibrillation with rapid ventricular response is most likely a result of the bilateral pneumonia. We will control his rate with Diltiazem orally and expect that as we treat his pneumonia his atrial fibrillation will get better. We will have IV Diltiazem available. I would avoid beta blockers at this point.  It does not seem that he is on beta blockers consistently and as a result I would avoid them, given his acute chronic obstructive pulmonary disease exacerbation.  Will continue anticoagulation with coumadin.   6.  Hypertension. Continue hydrochlorothiazide.  Hold off on Lasix as his blood pressure is a bit soft.  It is in the 1-teens at this point.  I worry that this patient might degenerate into septic shock, so hold off on Lasix at this point.  7.  CODE STATUS: Full code. Surrogate decision maker is his wife, Nghia Mcentee.  8.  Deep vein thrombosis prophylaxis. The patient is currently on therapeutic Coumadin.     Disposition. Patient is being admitted inpatient for management of sepsis due to pneumonia as well acute respiratory failure. I anticipate that he will require two hospital midnight stays for evaluation and management.  Time Spent coordinating admission, including evaluating the patient, discussing plan with  patient and his wife was 40 minutes.  ____________________________ Samson Frederic, DO [id number] D: 05/23/2012 13:26:00 ET T: 05/23/2012 15:31:39 ET JOB#: [id number]  cc:  1.  Samson Frederic, DO, <Dictator> 2.  Minna Merritts, MD 3.  Barnie Alderman, MD, [place], MD, [place] SIGNED 05/27/2012 5:13
